# Patient Record
Sex: Male | Born: 1975 | Race: Black or African American | Hispanic: No | Marital: Single | Smoking: Current every day smoker
Health system: Southern US, Community
[De-identification: ages and names within clinical notes are randomized; demographics above are authoritative.]

## PROBLEM LIST (undated history)

## (undated) DIAGNOSIS — G20A1 Parkinson's disease without dyskinesia, without mention of fluctuations: Secondary | ICD-10-CM

## (undated) DIAGNOSIS — H269 Unspecified cataract: Secondary | ICD-10-CM

## (undated) DIAGNOSIS — E119 Type 2 diabetes mellitus without complications: Secondary | ICD-10-CM

## (undated) DIAGNOSIS — R569 Unspecified convulsions: Secondary | ICD-10-CM

## (undated) DIAGNOSIS — G20 Parkinson's disease: Secondary | ICD-10-CM

## (undated) HISTORY — DX: Parkinson's disease without dyskinesia, without mention of fluctuations: G20.A1

## (undated) HISTORY — DX: Unspecified cataract: H26.9

## (undated) HISTORY — DX: Parkinson's disease: G20

---

## 2012-12-06 NOTE — ED Notes (Signed)
Formatting of this note might be different from the original.  Olegario Messier MSW in to speak with pt  Electronically signed by Lorna Few, RN at 12/06/2012  2:51 PM EST

## 2012-12-06 NOTE — ED Provider Notes (Signed)
Formatting of this note is different from the original.  [facility]    ED Provider Note    Anthony Schultz 37 y.o. male DOB: 1976/07/16 MRN: [id number]  History     Chief Complaint   Patient presents with   ? Medication Refill     needs dilantin refill     History provided by:  Patient  Medication Refill  This is a new problem. Episode onset: patient ran out of dilantin on Monday.     Past Medical History   Diagnosis Date   ? Seizures    ? Epilepsy      History reviewed. No pertinent past surgical history.    History   Alcohol Use No     History   Smoking status   ? Light Tobacco Smoker   Smokeless tobacco   ? Not on file     History   Drug Use No     No Known Allergies    Discharge Medication List as of 12/06/2012  2:51 PM     CONTINUE these medications which have NOT CHANGED    Details   !! Phenytoin (DILANTIN PO) Take by mouth., Until Discontinued, Historical Med      !! - Potential duplicate medications found. Please discuss with provider.       Review of Systems   Constitutional: Negative.    Respiratory: Negative.    Cardiovascular: Negative.    Skin: Negative.    Neurological: Negative for dizziness, tremors, seizures, weakness, light-headedness and headaches.   Psychiatric/Behavioral: The patient is not nervous/anxious.      Physical Exam   ED Triage Vitals   BP 12/06/12 1351 121/67 mmHg   Heart Rate 12/06/12 1351 84   Resp 12/06/12 1351 18   SpO2 12/06/12 1351 96 %   Temp 12/06/12 1351 98.2 F (36.8 C)     Physical Exam   Nursing note and vitals reviewed.  Constitutional: He is oriented to person, place, and time. Vital signs are normal. He appears well-developed and well-nourished. No distress.   HENT:   Head: Normocephalic.   Eyes: Pupils are equal, round, and reactive to light.   Cardiovascular: Normal rate.    Pulmonary/Chest: Effort normal.   Musculoskeletal: Normal range of motion.   Neurological: He is alert and oriented to person, place, and time.   Skin: Skin is warm and  dry.   Psychiatric: He has a normal mood and affect. His behavior is normal. Judgment normal.     ED Course     No data to display   Imaging:  No results found for this visit on 12/06/12.  ECG:  No results found for this visit on 12/06/12.    Procedures    MDM  Number of Diagnoses or Management Options  Diagnosis management comments: 2:45 PM  Patient requests refill of Dilantin and recommendations for housing (patient is temporarily in [place], moving from [place] to [place])  Discussed with Social Services  Rx Dilantin      Amount and/or Complexity of Data Reviewed  Decide to obtain previous medical records or to obtain history from someone other than the patient: yes    Patient Progress  Patient progress: stable    Coding    Discharge Medication List as of 12/06/2012  2:51 PM     START taking these medications    Details   !! phenytoin (DILANTIN) 100 MG ER capsule Take 1 capsule (100 mg total) by mouth 3 (three)  times a day., Starting 12/06/2012, Last dose on Fri 01/05/13, Print      !! - Potential duplicate medications found. Please discuss with provider.       Clinical Impression     Final diagnoses:   Encounter for medication refill   URI (upper respiratory infection)     ED Disposition    Disposition Comments    Discharge [facility] discharge to home/self care.  Condition at discharge: Stable        John A. Cyndie Mull  12/08/12 1610  Electronically signed by Malcolm Metro, MD at 12/11/2012 11:50 PM EST

## 2012-12-06 NOTE — Progress Notes (Signed)
Formatting of this note is different from the original.  [facility]  ED Social Work     Patient:   Anthony Schultz  MR Number:  [id number]  Patient Date of Birth: 1976-09-14  Age/Sex:  37 y.o./male        Case Type:  Medication Assistance    Pt states he arrived in [place] 2 weeks ago from [place]. He plans to make his way to [facility] where he lived previously. HE has been staying at the [facility]. He does not have money to pay for his seizure medication. Voucher faxed to [facility] pharmacy for dilantin. Information provided on Community [employer].    Electronically signed:  Fermin Schwab, MSW LCSW  12/06/2012 / 3:18 PM      Electronically signed by Deeann Cree, MSW LCSW at 12/06/2012  3:20 PM EST

## 2012-12-06 NOTE — ED Provider Notes (Signed)
Formatting of this note might be different from the original.  I have reviewed and agree with the MLP's findings and plan for this patient.  Malcolm Metro, MD  [facility] EMERGENCY DEPARTMENT - 12/11/2012 11:50 PM    Malcolm Metro, MD  12/11/12 2350  Electronically signed by Malcolm Metro, MD at 12/11/2012 11:50 PM EST

## 2014-02-26 ENCOUNTER — Encounter (HOSPITAL_COMMUNITY): Payer: Self-pay | Admitting: Emergency Medicine

## 2014-02-26 ENCOUNTER — Emergency Department (HOSPITAL_COMMUNITY)
Admission: EM | Admit: 2014-02-26 | Discharge: 2014-02-26 | Disposition: A | Discharge status: Home / Self Care | Payer: Self-pay | Attending: Emergency Medicine | Admitting: Emergency Medicine

## 2014-02-26 DIAGNOSIS — G40909 Epilepsy, unspecified, not intractable, without status epilepticus: Secondary | ICD-10-CM | POA: Insufficient documentation

## 2014-02-26 DIAGNOSIS — Z760 Encounter for issue of repeat prescription: Secondary | ICD-10-CM

## 2014-02-26 DIAGNOSIS — F17200 Nicotine dependence, unspecified, uncomplicated: Secondary | ICD-10-CM | POA: Insufficient documentation

## 2014-02-26 HISTORY — DX: Unspecified convulsions: R56.9

## 2014-02-26 MED ORDER — PERMETHRIN 5 % EX CREA: TOPICAL_CREAM | CUTANEOUS | Status: DC

## 2014-02-26 MED ORDER — PHENYTOIN SODIUM EXTENDED 100 MG PO CAPS: 100.0000 mg | ORAL_CAPSULE | Freq: Three times a day (TID) | ORAL | Status: DC

## 2014-02-26 MED ORDER — DIVALPROEX SODIUM 500 MG PO DR TAB: 500.0000 mg | DELAYED_RELEASE_TABLET | Freq: Every day | ORAL | Status: DC

## 2014-02-26 NOTE — ED Notes (Signed)
[place] and doesn't have primary doctor set up yet.

## 2014-02-26 NOTE — Discharge Planning (Signed)
P4[facility] William Walls, [employer] Liaison  Spoke to patient about primary care resources and establishing care with a provider. Patient states he is new to the area and is out of his seizure medications. I [facility] with the provider on site, being that the patient is homeless at this time. Primary care resource guide,community resources, and my contact information was provided for any future questions or concerns.

## 2014-02-26 NOTE — Progress Notes (Signed)
  CARE MANAGEMENT ED NOTE 02/26/2014  Patient:  William MedinaMILES,Syrus   Account Number:  [id number]  Date Initiated:  02/26/2014  Documentation initiated by:  Ferdinand CavaSCHETTINO,Caterine Mcmeans  Subjective/Objective Assessment:   38 yo male, homeless, requesting refills for medications     Subjective/Objective Assessment Detail:   William Walls is a 38 y.o. male who presents to the Emergency Department requesting medication refill today. Patient recently moved to [place] from [place] and needs refills for seizure medications. He does not have a PCP in [place].  He takes Depakote 500 mg QD and Dilantin 100 mg TID but states he does not have the funds to pay for them. He denies recent seizures. He denies any other symptoms at this time.     Action/Plan:   Action/Plan Detail:   Anticipated DC Date:       Status Recommendation to Physician:   Result of Recommendation:      DC Planning Services  CM consult  MATCH Program  Other  PCP issues    Choice offered to / List presented to:  C-1 Patient          Status of service:  Completed, signed off  ED Comments:   ED Comments Detail:  Patient stated that he recently moved here from [place] and is homeless. He stated that he has been without his Depakote for his Parkinsons and Dilantin for his history of seizures and that he came in to the ED to try to get these medications filled. Explained the [facility] program to the patient to assist with medications refills and patient stated that he does not have to 3$ copay. Explained that have received approval to override the copay so that once the PA gives him the prescriptions for the medications that he needs to take the [facility] to fill the medications. Also provided the patient with the 5 page resource list for [place]. The [facility] rep, Felicia, was present and informed the patient that she has [facility] so that [facility] to be seen by the NP and then get set  up with the purple cared and [facility] so he can follow up with his medical crae and medication assistance.

## 2014-02-26 NOTE — ED Notes (Signed)
Pt reports out of dilantin 100mg  x 1 month. No seizures reported.

## 2014-02-26 NOTE — Discharge Instructions (Signed)
[facility] and use the resources provided. Refer to attached documents for more information.

## 2014-02-26 NOTE — ED Provider Notes (Signed)
CSN: [id number]     Arrival date & time 02/26/14  1038 History  This chart was scribed for non-physician practitioner Emilia BeckKaitlyn Jeniyah Menor, PA-C working with Layla MawKristen N Ward, DO by Leone PayorSonum Patel, ED Scribe. This patient was seen in room TR11C/TR11C and the patient's care was started at 11:55 AM.    Chief Complaint  Patient presents with  . Medication Refill      The history is provided by the patient. No language interpreter was used.    HPI Comments: William Walls is a 38 y.o. male who presents to the Emergency Department requesting medication refill today. [place] to [place] and needs refills for seizure medications. He does not have a PCP in [place].  He takes Depakote 500 mg QD and Dilantin 100 mg TID but states he does not have the funds to pay for them. He denies recent seizures. He denies any other symptoms at this time.    Past Medical History  Diagnosis Date  . Seizures    History reviewed. No pertinent past surgical history. No family history on file. History  Substance Use Topics  . Smoking status: Current Every Day Smoker  . Smokeless tobacco: Not on file  . Alcohol Use: No    Review of Systems  Constitutional: Negative for fever.  Eyes: Negative for visual disturbance.  Neurological: Negative for seizures.  All other systems reviewed and are negative.     Allergies  Review of patient's allergies indicates no known allergies.  Home Medications   Prior to Admission medications   Not on File   BP 121/69  Pulse 69  Temp(Src) 97.8 F (36.6 C) (Oral)  Resp 20  SpO2 100% Physical Exam  Nursing note and vitals reviewed. Constitutional: He is oriented to person, place, and time. He appears well-developed and well-nourished.  HENT:  Head: Normocephalic and atraumatic.  Eyes: Conjunctivae and EOM are normal.  Neck: Normal range of motion.  Cardiovascular: Normal rate.   Pulmonary/Chest: Effort normal.  Abdominal: He exhibits no  distension.  Musculoskeletal: Normal range of motion.  Neurological: He is alert and oriented to person, place, and time.  Skin: Skin is warm and dry.  Psychiatric: He has a normal mood and affect.    ED Course  Procedures (including critical care time)  DIAGNOSTIC STUDIES: Oxygen Saturation is 100% on RA, normal by my interpretation.    COORDINATION OF CARE: 11:55 AM Discussed treatment plan with pt at bedside and pt agreed to plan.   Labs Review Labs Reviewed - No data to display  Imaging Review No results found.   EKG Interpretation None      MDM   Final diagnoses:  Medication refill    Patient will have refill of Keppra. Vitals stable and patient afebrile. Patient is asymptomatic with no recent seizure.   I personally performed the services described in this documentation, which was scribed in my presence. The recorded information has been reviewed and is accurate.    Emilia BeckKaitlyn Conlan Miceli, [place]C 02/27/14 [phone]

## 2014-02-27 NOTE — ED Provider Notes (Signed)
Medical screening examination/treatment/procedure(s) were performed by non-physician practitioner and as supervising physician I was immediately available for consultation/collaboration.   EKG Interpretation None        [name], DO 02/27/14 0912 

## 2015-01-14 ENCOUNTER — Inpatient Hospital Stay
Admit: 2015-01-14 | Discharge: 2015-01-14 | Discharge status: Against Medical Advice | Payer: Medicare (Managed Care) | Attending: Emergency Medicine

## 2015-01-14 ENCOUNTER — Emergency Department: Admit: 2015-01-14 | Payer: Medicare (Managed Care) | Primary: Family Medicine

## 2015-01-14 DIAGNOSIS — R079 Chest pain, unspecified: Secondary | ICD-10-CM

## 2015-01-14 LAB — METABOLIC PANEL, COMPREHENSIVE
A-G Ratio: 1.1 (ref 0.8–1.7)
ALT (SGPT): 47 U/L (ref 16–61)
AST (SGOT): 32 U/L (ref 15–37)
Albumin: 3.6 g/dL (ref 3.4–5.0)
Alk. phosphatase: 74 U/L (ref 45–117)
Anion gap: 11 mmol/L (ref 3.0–18)
BUN/Creatinine ratio: 19 (ref 12–20)
BUN: 20 MG/DL — ABNORMAL HIGH (ref 7.0–18)
Bilirubin, total: 0.3 MG/DL (ref 0.2–1.0)
CO2: 24 mmol/L (ref 21–32)
Calcium: 8.7 MG/DL (ref 8.5–10.1)
Chloride: 108 mmol/L (ref 100–108)
Creatinine: 1.07 MG/DL (ref 0.6–1.3)
GFR est AA: >60 mL/min/{1.73_m2} (ref >60)
GFR est non-AA: >60 mL/min/{1.73_m2} (ref >60)
Globulin: 3.3 g/dL (ref 2.0–4.0)
Glucose: 112 mg/dL — ABNORMAL HIGH (ref 74–99)
Potassium: 4.1 mmol/L (ref 3.5–5.5)
Protein, total: 6.9 g/dL (ref 6.4–8.2)
Sodium: 143 mmol/L (ref 136–145)

## 2015-01-14 LAB — CBC WITH AUTOMATED DIFF
ABS. BASOPHILS: 0 10*3/uL (ref 0.0–0.06)
ABS. EOSINOPHILS: 0.2 10*3/uL (ref 0.0–0.4)
ABS. LYMPHOCYTES: 1.8 10*3/uL (ref 0.9–3.6)
ABS. MONOCYTES: 0.5 10*3/uL (ref 0.05–1.2)
ABS. NEUTROPHILS: 2.8 10*3/uL (ref 1.8–8.0)
BASOPHILS: 0 % (ref 0–2)
EOSINOPHILS: 3 % (ref 0–5)
HCT: 44.7 % (ref 36.0–48.0)
HGB: 15.1 g/dL (ref 13.0–16.0)
LYMPHOCYTES: 35 % (ref 21–52)
MCH: 29.2 PG (ref 24.0–34.0)
MCHC: 33.8 g/dL (ref 31.0–37.0)
MCV: 86.5 FL (ref 74.0–97.0)
MONOCYTES: 9 % (ref 3–10)
MPV: 10.1 FL (ref 9.2–11.8)
NEUTROPHILS: 53 % (ref 40–73)
PLATELET: 241 10*3/uL (ref 135–420)
RBC: 5.17 M/uL (ref 4.70–5.50)
RDW: 13.8 % (ref 11.6–14.5)
WBC: 5.2 10*3/uL (ref 4.6–13.2)

## 2015-01-14 LAB — URINALYSIS W/ RFLX MICROSCOPIC
Bilirubin: NEGATIVE
Blood: NEGATIVE
Glucose: NEGATIVE mg/dL
Leukocyte Esterase: NEGATIVE
Nitrites: NEGATIVE
Specific gravity: >1.03 — ABNORMAL HIGH (ref 1.005–1.030)
Urobilinogen: 1 EU/dL (ref 0.2–1.0)
pH (UA): 5.5 (ref 5.0–8.0)

## 2015-01-14 LAB — CARDIAC PANEL,(CK, CKMB & TROPONIN)
CK - MB: 1 ng/ml (ref 0.5–3.6)
CK - MB: 1.8 ng/ml (ref 0.5–3.6)
CK - MB: 1.7 ng/ml (ref 0.5–3.6)
CK-MB Index: 0.2 % (ref 0.0–4.0)
CK-MB Index: 0.3 % (ref 0.0–4.0)
CK-MB Index: 0.3 % (ref 0.0–4.0)
CK: 585 U/L — ABNORMAL HIGH (ref 39–308)
CK: 531 U/L — ABNORMAL HIGH (ref 39–308)
CK: 508 U/L — ABNORMAL HIGH (ref 39–308)
Troponin-I, QT: <0.02 NG/ML (ref 0.00–0.06)
Troponin-I, QT: <0.02 NG/ML (ref 0.00–0.06)
Troponin-I, QT: <0.02 NG/ML (ref 0.00–0.06)

## 2015-01-14 LAB — DRUG SCREEN, URINE
AMPHETAMINES: NEGATIVE
BARBITURATES: NEGATIVE
BENZODIAZEPINES: NEGATIVE
COCAINE: NEGATIVE
METHADONE: NEGATIVE
OPIATES: NEGATIVE
PCP(PHENCYCLIDINE): NEGATIVE
THC (TH-CANNABINOL): POSITIVE — AB

## 2015-01-14 LAB — URINE MICROSCOPIC ONLY
Bacteria: NEGATIVE /hpf
WBC: 0 /hpf (ref 0–5)

## 2015-01-14 LAB — LIPASE: Lipase: 150 U/L (ref 73–393)

## 2015-01-14 MED ORDER — KETOROLAC TROMETHAMINE 30 MG/ML INJECTION
30 mg/mL (1 mL) | INTRAMUSCULAR | Status: AC
Start: 2015-01-14 — End: 2015-01-14
  Administered 2015-01-14: 12:00:00 via INTRAVENOUS

## 2015-01-14 MED ORDER — LORAZEPAM 2 MG/ML IJ SOLN
2 mg/mL | Freq: Once | INTRAMUSCULAR | Status: AC
Start: 2015-01-14 — End: 2015-01-14
  Administered 2015-01-14: 14:00:00 via INTRAVENOUS

## 2015-01-14 MED ORDER — ASPIRIN 81 MG CHEWABLE TAB
81 mg | Freq: Every day | ORAL | Status: DC
Start: 2015-01-14 — End: 2015-01-15

## 2015-01-14 MED ORDER — ASPIRIN 81 MG CHEWABLE TAB
81 mg | ORAL | Status: AC
Start: 2015-01-14 — End: 2015-01-14
  Administered 2015-01-14: 12:00:00 via ORAL

## 2015-01-14 MED ORDER — PHENYTOIN SODIUM EXTENDED 100 MG CAP
100 mg | Freq: Three times a day (TID) | ORAL | Status: DC
Start: 2015-01-14 — End: 2015-01-15
  Administered 2015-01-14: 23:00:00 via ORAL

## 2015-01-14 MED ORDER — ENOXAPARIN 40 MG/0.4 ML SUB-Q SYRINGE
40 mg/0.4 mL | SUBCUTANEOUS | Status: DC
Start: 2015-01-14 — End: 2015-01-15
  Administered 2015-01-14: 23:00:00 via SUBCUTANEOUS

## 2015-01-14 MED FILL — LOVENOX 40 MG/0.4 ML SUBCUTANEOUS SYRINGE: 40 mg/0.4 mL | SUBCUTANEOUS | Qty: 0.4

## 2015-01-14 MED FILL — LORAZEPAM 2 MG/ML IJ SOLN: 2 mg/mL | INTRAMUSCULAR | Qty: 1

## 2015-01-14 MED FILL — KETOROLAC TROMETHAMINE 30 MG/ML INJECTION: 30 mg/mL (1 mL) | INTRAMUSCULAR | Qty: 1

## 2015-01-14 MED FILL — ASPIRIN 81 MG CHEWABLE TAB: 81 mg | ORAL | Qty: 4

## 2015-01-14 MED FILL — DILANTIN EXTENDED 100 MG CAPSULE: 100 mg | ORAL | Qty: 1

## 2015-01-14 NOTE — Progress Notes (Signed)
Formatting of this note is different from the original.  Readmission Risk Assessment: Low Risk and MSSP/Good Help ACO patients    RRAT Score: 1 - 12    Initial Assessment:Emergency Contact: chart reviewed and spoke with patient at bedside,pt states he's here visiting from [place],and came to ER with c/o chest pain and SOB,pt did state he has [employer] but doesn't have insurance card,[employer] can assist with financial application.Oral and Written notification given to patient and/or caregiver informing them that they are currently an Outpatient receiving care in our facility.  Outpatient services include Observation Services.     Pertinent Medical Hx: see chart  PCP/Specialists: Community Services:     DME: to be determined    Low Risk Care Transition Plan:  1. Evaluate for [facility] or H2H, community care coordination of resources  2. Involve patient/caregiver in assessment, planning, education and implement of intervention.  3. CM daily patient care huddles/interdisciplinary rounds.  4. PCP/Specialist appointment within 7 - 10 days made prior to discharge.  5. Facilitate transportation and logistics for follow-up appointments.  6. Handoff to [employer] Nurse Navigator or PCP practice.         Electronically signed by Fidela Salisbury, RN at 01/14/2015  2:34 PM EST

## 2015-01-14 NOTE — H&P (Signed)
Formatting of this note is different from the original.  Images from the original note were not included.    History & Physical    Patient: Anthony Schultz MRN: [id number]  CSN: [id number]    Date of Birth: [birth year]  Age: 39 y.o.  Sex: male      DOA: 01/14/2015  Primary Care Provider:  Phys Other, MD    Assessment/Plan     Patient Active Problem List   Diagnosis Code   ? Chest pain R07.9   ? Seizure disorder (HCC) G40.909     Admit to telemetry  Will get serial cardiac enzymes   If negative then will get stress test   NPO past MN   If enzymes trend higher then will start the patient on iv heparin and consult cardiology   Will continue with antiplatelet agent at this point along with SL nitro prn   Oxygen supplementation as needed  Morphine as needed for chest pain  Continue home medications for seizures.  DVT prophylaxis    CC: chest pain    HPI:     Anthony Schultz is a 39 y.o. male who has past history of seizure presents to ER with complains of chest pain that started after he was lifting heavy boxes while helping his girlfriend who was moving. Located center of chest, sharp, no radiation. Not associated nausea, diaphoresis, SOB. He did not had this type of pain before. Denies any smoking, alcohol use and drug use. No family history of heart disease. He has history of seizures. However his urine drug screen is positive for THC. In ER his cardiac enzymes x 2 in normal range. He was bradycardic and His EKG showed bradycardia with non specific ST T wave changes.     Past Medical History   Diagnosis Date   ? Seizure disorder (HCC)      No past surgical history on file.    No family history on file.    History     Social History   ? Marital Status: SINGLE     Spouse Name: N/A   ? Number of Children: N/A   ? Years of Education: N/A     Social History Main Topics   ? Smoking status: Not on file   ? Smokeless tobacco: Not on file   ? Alcohol Use: Not on file   ? Drug Use: Not on file   ? Sexual Activity: Not on file     Other  Topics Concern   ? Not on file     Social History Narrative   ? No narrative on file     Prior to Admission medications    Medication Sig Start Date End Date Taking? Authorizing Provider   phenytoin ER (DILANTIN ER) 100 mg ER capsule Take 100 mg by mouth three (3) times daily.    Phys Other, MD     No Known Allergies    Review of Systems  Gen: No fever, chills, malaise, weight loss/gain.   Heent: No headache, rhinorrhea, epistaxis, ear pain, hearing loss, sinus pain, neck pain/stiffness, sore throat.   Heart: see above  Resp: No cough, hemoptysis, wheezing and shortness of breath.   GI: No nausea, vomiting, diarrhea, constipation, melena or hematochezia.   GU: No urinary obstruction, dysuria or hematuria.   Derm: No rash, new skin lesion or pruritis.   Musc/skeletal: no bone or joint complains.  Vasc: No edema, cyanosis or claudication.   Endo: No heat/cold intolerance, no polyuria,polydipsia or polyphagia.  Neuro: No unilateral weakness, numbness, tingling. No seizures.   Heme: No easy bruising or bleeding.    Physical Exam:     Physical Exam:  BP 121/66 mmHg  Pulse 53  Temp(Src) 97.7 F (36.5 C)  Resp 16  Ht 5\' 5"  (1.651 m)  Wt 106.595 kg (235 lb)  BMI 39.11 kg/m2  SpO2 99%   O2 Device: Room air    Temp (24hrs), Avg:97.9 F (36.6 C), Min:97.6 F (36.4 C), Max:98.3 F (36.8 C)      General:  Awake, cooperative, no distress.   Head:  Normocephalic, without obvious abnormality, atraumatic.   Eyes:  Conjunctivae/corneas clear, sclera anicteric, PERRL, EOMs intact.   Nose: Nares normal. No drainage or sinus tenderness.   Throat: Lips, mucosa, and tongue normal.    Neck: Supple, symmetrical, trachea midline, no adenopathy.     Lungs:   Clear to auscultation bilaterally.     Heart:  Regular rate and rhythm, S1, S2 normal, no murmur, click, rub or gallop.     Abdomen: Soft, non-tender. Bowel sounds normal. No masses,  No organomegaly.   Extremities: Extremities normal, atraumatic, no cyanosis or edema.  Capillary refill normal.   Pulses: 2+ and symmetric all extremities.   Skin: Skin color pink/pale/mottled/flushed, turgor normal. No rashes or lesions   Neurologic: CNII-XII intact. No focal motor or sensory deficit.     Labs Reviewed:    CMP:   Lab Results   Component Value Date/Time    NA 143 01/14/2015 07:00 AM    K 4.1 01/14/2015 07:00 AM    CL 108 01/14/2015 07:00 AM    CO2 24 01/14/2015 07:00 AM    AGAP 11 01/14/2015 07:00 AM    GLU 112* 01/14/2015 07:00 AM    BUN 20* 01/14/2015 07:00 AM    CREA 1.07 01/14/2015 07:00 AM    GFRAA >60 01/14/2015 07:00 AM    GFRNA >60 01/14/2015 07:00 AM    CA 8.7 01/14/2015 07:00 AM    ALB 3.6 01/14/2015 07:00 AM    TP 6.9 01/14/2015 07:00 AM    GLOB 3.3 01/14/2015 07:00 AM    AGRAT 1.1 01/14/2015 07:00 AM    SGOT 32 01/14/2015 07:00 AM    ALT 47 01/14/2015 07:00 AM     CBC:   Lab Results   Component Value Date/Time    WBC 5.2 01/14/2015 07:00 AM    HGB 15.1 01/14/2015 07:00 AM    HCT 44.7 01/14/2015 07:00 AM    PLT 241 01/14/2015 07:00 AM     Procedures/imaging: see electronic medical records for all procedures/Xrays and details which were not copied into this note but were reviewed prior to creation of Plan    CC: Phys Other, MD  Electronically signed by Perrin Maltese, MD at 01/14/2015  3:35 PM EST

## 2015-01-14 NOTE — ED Notes (Signed)
Formatting of this note is different from the original.  Triage note: Patient arrives via EMS from home with chest pain. Patient states he was smoking a cigarette on the couch when chest pain began. Patient denies any cardiac history and was moving boxes yesterday. Patient clutching his chest.    Sepsis Screening completed    (  )Patient meets SIRS criteria.  ( x )Patient does not meet SIRS criteria.    SIRS Criteria is achieved when two or more of the following are present  ? Temperature < 96.65F (36C) or > 100.65F (38.3C)  ? Heart Rate > 90 beats per minute  ? Respiratory Rate > 20 beats per minute  ? WBC count > 12,000 or <4,000 or > 10% bands    (  )Patient has a suspected source of infection.  ( x )Patient does not have a suspected source of infection.  Electronically signed by Polly Cobia, RN at 01/14/2015  6:46 AM EST

## 2015-01-14 NOTE — Unmapped (Signed)
Formatting of this note might be different from the original.  1200 Received report from [facility] in Emergency room.  1210 Transfer report given to floor nurse crystal Stanford Breed RN  1256 Admission history complete, patient oriented to room. Patient has stated at this time that he was directed by a physician at home in [place]. To stop taking his Dilantin, due to parkinson like symptoms and stop taking this drug in January of this year.  Electronically signed by Raquel James, RN at 01/14/2015 12:59 PM EST

## 2015-01-14 NOTE — ED Notes (Signed)
Formatting of this note might be different from the original.  Pt resting quietly on stretcher with eyes closed, responds easily to speech. Pt attempting to contact cousin for ride home prior to ativan able to be given. Pt's hands are shaky, pt verbalized understanding cannot receive medication until ride is present. States toradol has helped pain "some". VSS. Pt is sinus brady on monitor.   Electronically signed by Lieutenant Diego, RN at 01/14/2015  8:12 AM EST

## 2015-01-14 NOTE — ED Notes (Signed)
Formatting of this note might be different from the original.  Received report and assumed care of pt at this time. Pt resting quietly on stretcher with eyes open, VSS. NAD. C/o crushing chest pain onset this AM while smoking cigarette. IV site has no redness, swelling or tenderness. Remains patent and flushes easily with 5 ml NS. Pt denies discomfort to IV site. Bed lowered and locked, side rails up with call bell within reach. Pt oriented to call bell and room.  Electronically signed by Lieutenant Diego, RN at 01/14/2015  7:19 AM EST
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
## 2015-01-14 NOTE — Unmapped (Signed)
Formatting of this note is different from the original.    IDR/SLIDR Summary      Patient: Anthony Schultz MRN: [id number]    Age: 39 y.o.     Birthdate: 1976-11-03 Room/Bed: 342/01   Admit Diagnosis: chest pain, bradycardia  Principal Diagnosis: <principal problem not specified>   Goals: Go home.  Readmission: NO  Quality Measure: Not applicable  VTE Prophylaxis: Not yet ordered.   Influenza Vaccine screening completed? YES  Pneumococcal Vaccine screening completed? YES  Mobility needs: No   Nutrition plan:No  Consults:Case Management    Financial concerns:No  Escalated to CM? NO  RRAT Score: 11   Interventions:H2H  Testing due for pt today? YES  LOS:  days Expected length of stay 0-1 days  Discharge plan: DC Planning. PCP: Phys Other, MD  Transportation needs: No    Days before discharge:one day until discharge   Discharge disposition: Home    Echo today.  Pain relieved with Ativan.  Possible dc if Echo normal.  ? Disability/Medicaid. From [place].     Signed:     Presley Raddle, RN  01/14/2015  3:02 PM    Electronically signed by Presley Raddle, RN at 01/14/2015  3:06 PM EST

## 2015-01-14 NOTE — Progress Notes (Signed)
Formatting of this note might be different from the original.  Echocardiogram completed. Report to follow. Coletta Memos RCS  Electronically signed by Shiela Mayer at 01/14/2015  3:47 PM EST

## 2015-01-14 NOTE — ED Provider Notes (Signed)
Formatting of this note is different from the original.  HPI Comments:   6:49 AM  39 y.o. male presents to ED via EMS from home c/o sharp non-radiating midsternal chest pain (rated 10/10), onset while smoking a cigarette on his couch 50 minutes ago. Associated sxs include shortness of breath. At-home treatment includes Tylenol and he was not given any medication via EMS en route to the ED. He confirms hx of similar chest pain once before which was dx'd as bronchitis. PMHx includes seizures. Neurologist discontinued use of Dilantin. PFHx includes heart disease in father. Other sxs include dysuria. He denies EtOH use, drug use, sore throat, abdominal pain, nausea, vomiting, blood in stool, headache, rash, and any other sxs or complaints.     Patient is a 39 y.o. male presenting with chest pain. The history is provided by the patient. No language interpreter was used.   Chest Pain (Angina)   This is a new problem. The current episode started less than 1 hour ago. The problem has not changed since onset.The problem occurs constantly. Pain location: midsternal. The pain is at a severity of 10/10. The quality of the pain is described as sharp. The pain does not radiate. Associated symptoms include shortness of breath. Pertinent negatives include no nausea. Treatments tried: Tylenol. The treatment provided no relief. Risk factors include male gender.   Written by Jani Files, ED Scribe, as dictated by Hetty Blend, MD       Past Medical History:   Diagnosis Date   ? Seizure disorder (HCC)      No past surgical history on file.    No family history on file.    History     Social History   ? Marital Status: SINGLE     Spouse Name: N/A   ? Number of Children: N/A   ? Years of Education: N/A     Occupational History   ? Not on file.     Social History Main Topics   ? Smoking status: Not on file   ? Smokeless tobacco: Not on file   ? Alcohol Use: Not on file   ? Drug Use: Not on file   ? Sexual Activity: Not on file      Other Topics Concern   ? Not on file     Social History Narrative   ? No narrative on file     ALLERGIES: Review of patient's allergies indicates no known allergies.    Review of Systems   Respiratory: Positive for shortness of breath.    Cardiovascular: Positive for chest pain.   Gastrointestinal: Negative for nausea.   All other systems reviewed and are negative.    Filed Vitals:    01/14/15 1030 01/14/15 1100 01/14/15 1115 01/14/15 1118   BP: 129/78 118/77 115/83    Pulse: 45 64 46 40   Temp:       Resp: 17 23 15 17    Height:       Weight:       SpO2: 94% 94%  98%       Physical Exam   Nursing note and vitals reviewed.    CONSTITUTIONAL: Alert, in no apparent distress; well-developed; overweight. Holding middle of chest.  HEAD:  Normocephalic, atraumatic  EYES: PERRL; EOM's intact.  ENTM: Nose: no rhinorrhea; Throat: no erythema or exudate, mucous membranes moist. Missing front teeth.  Neck:  No JVD, supple without lymphadenopathy  RESP: Chest clear, equal breath sounds.  CHEST: Chest wall tenderness  to palpation.  CV: S1 and S2 WNL; No murmurs, gallops or rubs.  GI: Normal bowel sounds, abdomen soft and non-tender. No masses or organomegaly.  GU: No costo-vertebral angle tenderness.  BACK:  Non-tender  UPPER EXT:  Normal inspection  LOWER EXT: No edema, no calf tenderness.  Distal pulses intact.  NEURO: CN intact, reflexes 2/4 and sym, strength 5/5 and sym, sensation intact.  SKIN: No rashes; Normal for age and stage.  PSYCH:  Alert and oriented, normal affect.    RESULTS:    EKG interpretation 1: (Preliminary)  6:43 AM   Sinus bradycardia. Rate 57 bpm. Otherwise normal ECG. Read by Hetty Blend, MD     EKG FINDING 2:   8:49 AM  Rhythm: sinus bradycardia; rate. 49 bpm; Other findings: non-specific ST and T wave abnormality;  Read by Hetty Blend, MD at 9:13 AM.  Written by Kristopher Oppenheim, ED Scribe, as dictated by Kenn File, MD.     XR CHEST PORT    (Results Pending)  Impression:    No  acute cardiopulmonary disease.    As read by the radiologist.       Labs Reviewed   METABOLIC PANEL, COMPREHENSIVE - Abnormal; Notable for the following:     Glucose 112 (*)     BUN 20 (*)     All other components within normal limits   CARDIAC PANEL,(CK, CKMB & TROPONIN) - Abnormal; Notable for the following:     CK 585 (*)     All other components within normal limits   URINALYSIS W/ RFLX MICROSCOPIC - Abnormal; Notable for the following:     Specific gravity >1.030 (*)     Protein TRACE (*)     Ketone TRACE (*)     All other components within normal limits   DRUG SCREEN, URINE - Abnormal; Notable for the following:     THC (TH-CANNABINOL) POSITIVE (*)     All other components within normal limits   URINE MICROSCOPIC ONLY - Abnormal; Notable for the following:     Mucus FEW (*)     All other components within normal limits   CARDIAC PANEL,(CK, CKMB & TROPONIN) - Abnormal; Notable for the following:     CK 531 (*)     All other components within normal limits   CBC WITH AUTOMATED DIFF   LIPASE     Recent Results (from the past 12 hour(s))   EKG, 12 LEAD, INITIAL    Collection Time: 01/14/15  6:43 AM   Result Value Ref Range    Ventricular Rate 57 BPM    Atrial Rate 57 BPM    P-R Interval 140 ms    QRS Duration 76 ms    Q-T Interval 432 ms    QTC Calculation (Bezet) 420 ms    Calculated P Axis 17 degrees    Calculated R Axis -25 degrees    Calculated T Axis -17 degrees    Diagnosis       Sinus bradycardia  Otherwise normal ECG  No previous ECGs available    CBC WITH AUTOMATED DIFF    Collection Time: 01/14/15  7:00 AM   Result Value Ref Range    WBC 5.2 4.6 - 13.2 K/uL    RBC 5.17 4.70 - 5.50 M/uL    HGB 15.1 13.0 - 16.0 g/dL    HCT 62.9 52.8 - 41.3 %    MCV 86.5 74.0 - 97.0 FL    MCH 29.2 24.0 -  34.0 PG    MCHC 33.8 31.0 - 37.0 g/dL    RDW 16.1 09.6 - 04.5 %    PLATELET 241 135 - 420 K/uL    MPV 10.1 9.2 - 11.8 FL    NEUTROPHILS 53 40 - 73 %    LYMPHOCYTES 35 21 - 52 %    MONOCYTES 9 3 - 10 %    EOSINOPHILS 3 0 - 5  %    BASOPHILS 0 0 - 2 %    ABS. NEUTROPHILS 2.8 1.8 - 8.0 K/UL    ABS. LYMPHOCYTES 1.8 0.9 - 3.6 K/UL    ABS. MONOCYTES 0.5 0.05 - 1.2 K/UL    ABS. EOSINOPHILS 0.2 0.0 - 0.4 K/UL    ABS. BASOPHILS 0.0 0.0 - 0.06 K/UL    DF AUTOMATED     METABOLIC PANEL, COMPREHENSIVE    Collection Time: 01/14/15  7:00 AM   Result Value Ref Range    Sodium 143 136 - 145 mmol/L    Potassium 4.1 3.5 - 5.5 mmol/L    Chloride 108 100 - 108 mmol/L    CO2 24 21 - 32 mmol/L    Anion gap 11 3.0 - 18 mmol/L    Glucose 112 (H) 74 - 99 mg/dL    BUN 20 (H) 7.0 - 18 MG/DL    Creatinine 4.09 0.6 - 1.3 MG/DL    BUN/Creatinine ratio 19 12 - 20      GFR est AA >60 >60 ml/min/1.51m2    GFR est non-AA >60 >60 ml/min/1.19m2    Calcium 8.7 8.5 - 10.1 MG/DL    Bilirubin, total 0.3 0.2 - 1.0 MG/DL    ALT 47 16 - 61 U/L    AST 32 15 - 37 U/L    Alk. phosphatase 74 45 - 117 U/L    Protein, total 6.9 6.4 - 8.2 g/dL    Albumin 3.6 3.4 - 5.0 g/dL    Globulin 3.3 2.0 - 4.0 g/dL    A-G Ratio 1.1 0.8 - 1.7     LIPASE    Collection Time: 01/14/15  7:00 AM   Result Value Ref Range    Lipase 150 73 - 393 U/L   CARDIAC PANEL,(CK, CKMB & TROPONIN)    Collection Time: 01/14/15  7:00 AM   Result Value Ref Range    CK 585 (H) 39 - 308 U/L    CK - MB 1.0 0.5 - 3.6 ng/ml    CK-MB Index 0.2 0.0 - 4.0 %    Troponin-I, Qt. <0.02 0.00 - 0.06 NG/ML   URINALYSIS W/ RFLX MICROSCOPIC    Collection Time: 01/14/15  7:05 AM   Result Value Ref Range    Color YELLOW      Appearance CLEAR      Specific gravity >1.030 (H) 1.005 - 1.030    pH (UA) 5.5 5.0 - 8.0      Protein TRACE (A) NEG mg/dL    Glucose NEGATIVE  NEG mg/dL    Ketone TRACE (A) NEG mg/dL    Bilirubin NEGATIVE  NEG      Blood NEGATIVE  NEG      Urobilinogen 1.0 0.2 - 1.0 EU/dL    Nitrites NEGATIVE  NEG      Leukocyte Esterase NEGATIVE  NEG     DRUG SCREEN, URINE    Collection Time: 01/14/15  7:05 AM   Result Value Ref Range    BENZODIAZEPINE NEGATIVE  NEG      BARBITURATES  NEGATIVE  NEG      THC (TH-CANNABINOL) POSITIVE (A)  NEG      OPIATES NEGATIVE  NEG      PCP(PHENCYCLIDINE) NEGATIVE  NEG      COCAINE NEGATIVE  NEG      AMPHETAMINE NEGATIVE  NEG      METHADONE NEGATIVE  NEG      HDSCOM (NOTE)    URINE MICROSCOPIC ONLY    Collection Time: 01/14/15  7:05 AM   Result Value Ref Range    WBC 0 to 2 0 - 5 /hpf    RBC NONE 0 - 5 /hpf    Epithelial cells FEW 0 - 5 /lpf    Bacteria NEGATIVE  NEG /hpf    Mucus FEW (A) NEG /lpf   CARDIAC PANEL,(CK, CKMB & TROPONIN)    Collection Time: 01/14/15  9:00 AM   Result Value Ref Range    CK 531 (H) 39 - 308 U/L    CK - MB 1.8 0.5 - 3.6 ng/ml    CK-MB Index 0.3 0.0 - 4.0 %    Troponin-I, Qt. <0.02 0.00 - 0.06 NG/ML   EKG, 12 LEAD, SUBSEQUENT    Collection Time: 01/14/15  9:13 AM   Result Value Ref Range    Ventricular Rate 49 BPM    Atrial Rate 49 BPM    P-R Interval 156 ms    QRS Duration 72 ms    Q-T Interval 452 ms    QTC Calculation (Bezet) 408 ms    Calculated P Axis 30 degrees    Calculated R Axis -7 degrees    Calculated T Axis -19 degrees    Diagnosis       Marked sinus bradycardia  Nonspecific ST and T wave abnormality  Abnormal ECG  When compared with ECG of 14-Jan-2015 06:43,  No significant change was found          MDM  Number of Diagnoses or Management Options  Acute chest pain:   Bradycardia:   Diagnosis management comments:     INITIAL CLINICAL IMPRESSION and PLANS:  The patient presents with the primary complaint(s) of: chest pain. The presentation, to include historical aspects and clinical findings are consistent with the DX of atypical chest pain. However, other possible DX's to consider as primary, associated with, or exacerbated by include:    1.  Pleuritic Chest Pain  2.  Pneumothorax  3.  ACS    Considering the above, my initial management plan to evaluate and therapeutic interventions include the following and as noted in the orders:    1.  Labs: CBC, CMP, Lipase, Cardiac Panel, UA, Drug Screen  2.  Imaging: Chest x-ray, EKG  3.  Medications: Toradol, ASA      Amount and/or  Complexity of Data Reviewed  Clinical lab tests: ordered and reviewed  Tests in the radiology section of CPT: ordered and reviewed (Chest x-ray)  Tests in the medicine section of CPT: ordered and reviewed (EKG)  Discuss the patient with other providers: yes Germain Osgood, MD  Asa Saunas, MD  )  Independent visualization of images, tracings, or specimens: yes (Chest x-ray, EKG)    MEDICATIONS GIVEN:  Medications   ketorolac (TORADOL) injection 30 mg (30 mg IntraVENous Given 01/14/15 0712)   aspirin chewable tablet 324 mg (324 mg Oral Given 01/14/15 0700)   LORazepam (ATIVAN) injection 2 mg (2 mg IntraVENous Given 01/14/15 0920)     Procedures    PROGRESS NOTE:  6:49  AM  Initial assessment performed.  Written by Jani Files, ED Scribe, as dictated by Hetty Blend, MD    PROGRESS NOTE:  7:42 AM   Patient is in the room crying, holding his chest. HR is 65 bpm. BP is 134/77 Gave ativan for his anxiety as well as his muscle spasms. His vitals are stable.   Written by Glendora Score, ED Scribe, as dictated by Kenn File, MD.    PROGRESS NOTE:  8:49 AM  Sitting up in bed, smiling now. In no obvious distress. HR is 54 bpm. Will repeat cardiac enzymes and EKG.   Written by Glendora Score, ED Scribe, as dictated by Kenn File, MD.    PROGRESS NOTE:  10:41 AM  Patient is pain free but heart rate has remained in the 40s. Will consult cardiology.   Written by Glendora Score, ED Scribe, as dictated by Kenn File, MD.    PROGRESS NOTE:  10:43 AM  Rhythm strip update: Patient?s HR is down in the 40s. Sign of bradycardia. No intervention needed at this time.   Written by Glendora Score, ED Scribe, as dictated by Kenn File, MD.    CONSULT NOTE:   11:10 AM  Hetty Blend, MD spoke with Germain Osgood, MD   Specialty: Internal Medicine  Discussed pt's hx, disposition, and available diagnostic and imaging results. Reviewed care plans. Consulting physician agrees with plans  as outlined.  Would like consult with cardiology before discussion of possible admission.   Written by Glendora Score, ED Scribe, as dictated by Kenn File, MD.    CONSULT NOTE:   11:10 AM  Hetty Blend, MD spoke with Asa Saunas, MD   Specialty: Cardiology  Discussed pt's hx, disposition, and available diagnostic and imaging results. Reviewed care plans. Consulting physician agrees with plans as outlined.  Would like a bedside echo. Will consider discharge home if normal.   Written by Glendora Score, ED Scribe, as dictated by Kenn File, MD.    PROGRESS NOTE:  11:38 AM  Updated with his lab results, EKG, and need for an echo.   Written by Glendora Score, ED Scribe, as dictated by Kenn File, MD.    DISCUSSION:  11:54 AM  Patient presented with chest pain that began after waking up. He had non-specific changes on KEG. Pain better with ASA and Toradol. Patient has had Bradycardic and occasional hypotensive episodes. Initially wanted bedside echo and poss discharge. However, patient still has occasional hypotension 99/65, will proceed with admission for observation.   Written by Glendora Score, ED Scribe, as dictated by Kenn File, MD.    CONSULT NOTE:   11:54 AM  Hetty Blend, MD spoke with Germain Osgood, MD   Specialty: Internal Medicine  Discussed pt's hx, disposition, and available diagnostic and imaging results. Reviewed care plans. Consulting physician agrees with plans as outlined.  Will admit the patient for observation.   Written by Kristopher Oppenheim, ED Scribe, as dictated by Hetty Blend, MD    ADMISSION NOTE:  11:54 AM  Patient is being admitted to the hospital by Germain Osgood, MD. The results of their tests and reasons for their admission have been discussed with them and/or available family. They convey agreement and understanding for the need to be admitted and for their admission diagnosis.    Written by Kristopher Oppenheim, ED Scribe, as  dictated by Hetty Blend, MD.    CONDITIONS ON ADMISSION:  Deep Vein Thrombosis is not present at the time of admission. Thrombosis is not present at the time of admission. Urinary Tract Infection is not present at the time of admission. Pneumonia is not present at the time of admission. MRSA is not present at the time of admission. Wound infection is not present at the time of admission. Pressure Ulcer is not present at the time of admission.   Written by Kristopher Oppenheim, ED Scribe, as dictated by Hetty Blend, MD.    CLINICAL IMPRESSION:    1. Acute chest pain    2. Bradycardia      ATTESTATIONS:  This note is prepared by Jani Files and Glendora Score, acting as Scribe for Hetty Blend, MD.    Hetty Blend, MD: The scribe's documentation has been prepared under my direction and personally reviewed by me in its entirety. I confirm that the note above accurately reflects all work, treatment, procedures, and medical decision making performed by me.   Electronically signed by Hetty Blend, MD at 01/16/2015 12:37 AM EST

## 2015-01-14 NOTE — ED Notes (Signed)
Formatting of this note might be different from the original.  Pt made aware by Dr. Will Bonnet will be admitted for observation purposes and echo.  Electronically signed by Lieutenant Diego, RN at 01/14/2015 11:54 AM EST

## 2015-01-14 NOTE — ED Notes (Signed)
Formatting of this note might be different from the original.  Pt's family at bedside states will be back to pick pt up when ready to go. Pt requesting breakfast tray, resting quietly on stretcher appears calm and comfortable. VSS.  Electronically signed by Lieutenant Diego, RN at 01/14/2015  9:25 AM EST

## 2015-01-14 NOTE — ED Notes (Signed)
Formatting of this note might be different from the original.  Pt standing up in room fully dressed. Pt aware of pending admission, RN requested pt remove shirt again and place gown back on. Pt is on phone attempting to reach family.   Electronically signed by Lieutenant Diego, RN at 01/14/2015 11:57 AM EST

## 2015-01-14 NOTE — ED Notes (Signed)
Formatting of this note is different from the original.  TRANSFER - OUT REPORT:    Verbal report given to Gerre Pebbles, RN on [facility]  being transferred to telemetry for routine progression of care       Report consisted of patient?s Situation, Background, Assessment and   Recommendations(SBAR).     Information from the following report(s) SBAR, ED Summary, MAR, Recent Results and Cardiac Rhythm sinus bradycardia was reviewed with the receiving nurse.    Lines:   Peripheral IV 01/14/15 Right Antecubital (Active)   Site Assessment Clean, dry, & intact 01/14/2015  7:00 AM   Phlebitis Assessment 0 01/14/2015  7:00 AM   Infiltration Assessment 0 01/14/2015  7:00 AM   Dressing Status Clean, dry, & intact 01/14/2015  7:00 AM   Dressing Type Transparent 01/14/2015  7:00 AM   Hub Color/Line Status Patent;Flushed 01/14/2015  7:00 AM   Action Taken Blood drawn 01/14/2015  7:00 AM       Opportunity for questions and clarification was provided.      Patient transported with:   Passenger transport manager signed by Lieutenant Diego, RN at 01/14/2015 12:04 PM EST

## 2015-01-14 NOTE — ED Notes (Signed)
Formatting of this note might be different from the original.  Pt resting quietly on stretcher with eyes closed, responds easily to speech. Pt remains sinus brady on monitor ranging from mid 40s-60s HR. Pending further orders and disposition as needed. Second set of cardiac enzymes negative.  Electronically signed by Lieutenant Diego, RN at 01/14/2015 11:14 AM EST

## 2015-01-14 NOTE — Unmapped (Signed)
Formatting of this note is different from the original.  TRANSFER - IN REPORT:    Verbal report received from [facility]) on [facility]  being received from ED(unit) for routine progression of care      Report consisted of patient?s Situation, Background, Assessment and   Recommendations(SBAR).     Information from the following report(s) SBAR, Kardex and MAR was reviewed with the receiving nurse.    Opportunity for questions and clarification was provided.      Assessment completed upon patient?s arrival to unit and care assumed.       Electronically signed by Burnett Kanaris., RN at 01/14/2015 12:27 PM EST

## 2015-01-14 NOTE — ED Notes (Signed)
TRANSFER - OUT REPORT:    Verbal report given to Gerre PebblesLisa Watson, RN [facility] for routine progression of care       Report consisted of patient???s Situation, Background, Assessment and   Recommendations(SBAR).     Information from the following report(s) SBAR, ED Summary, MAR, Recent Results and Cardiac Rhythm sinus bradycardia was reviewed with the receiving nurse.    Lines:   Peripheral IV 01/14/15 Right Antecubital (Active)   Site Assessment Clean, dry, & intact 01/14/2015  7:00 AM   Phlebitis Assessment 0 01/14/2015  7:00 AM   Infiltration Assessment 0 01/14/2015  7:00 AM   Dressing Status Clean, dry, & intact 01/14/2015  7:00 AM   Dressing Type Transparent 01/14/2015  7:00 AM   Hub Color/Line Status Patent;Flushed 01/14/2015  7:00 AM   Action Taken Blood drawn 01/14/2015  7:00 AM        Opportunity for questions and clarification was provided.      Patient transported with:   Monitor  Tech

## 2015-01-14 NOTE — ED Notes (Signed)
Triage note: Patient arrives via EMS from home with chest pain. Patient states he was smoking a cigarette on the couch when chest pain began. Patient denies any cardiac history and was moving boxes yesterday. Patient clutching his chest.    Sepsis Screening completed    (  )Patient meets SIRS criteria.  ( x )Patient does not meet SIRS criteria.      SIRS Criteria is achieved when two or more of the following are present  ? Temperature < 96.8??F (36??C) or > 100.9??F (38.3??C)  ? Heart Rate > 90 beats per minute  ? Respiratory Rate > 20 beats per minute  ? WBC count > 12,000 or <4,000 or > 10% bands      (  )Patient has a suspected source of infection.  ( x )Patient does not have a suspected source of infection.

## 2015-01-14 NOTE — ED Provider Notes (Signed)
HPI Comments:   6:49 AM  39 y.o. male presents to ED via EMS from home c/o sharp non-radiating midsternal chest pain (rated 10/10), onset while smoking a cigarette on his couch 50 minutes ago. Associated sxs include shortness of breath. At-home treatment includes Tylenol and he was not given any medication via EMS en route to the ED. He confirms hx of similar chest pain once before which was dx'd as bronchitis. PMHx includes seizures. Neurologist discontinued use of Dilantin. PFHx includes heart disease in father. Other sxs include dysuria. He denies EtOH use, drug use, sore throat, abdominal pain, nausea, vomiting, blood in stool, headache, rash, and any other sxs or complaints.     Patient is a 39 y.o. male presenting with chest pain. The history is provided by the patient. No language interpreter was used.   Chest Pain (Angina)   This is a new problem. The current episode started less than 1 hour ago. The problem has not changed since onset.The problem occurs constantly. Pain location: midsternal. The pain is at a severity of 10/10. The quality of the pain is described as sharp. The pain does not radiate. Associated symptoms include shortness of breath. Pertinent negatives include no nausea. Treatments tried: Tylenol. The treatment provided no relief. Risk factors include male gender.   Written by Marina Gravel, ED Scribe, as dictated by Jackelyn Hoehn, MD        Past Medical History:   Diagnosis Date   ??? Seizure disorder [facility])        No past surgical history on file.      No family history on file.    History     Social History   ??? Marital Status: SINGLE     Spouse Name: N/A   ??? Number of Children: N/A   ??? Years of Education: N/A     Occupational History   ??? Not on file.     Social History Main Topics   ??? Smoking status: Not on file   ??? Smokeless tobacco: Not on file   ??? Alcohol Use: Not on file   ??? Drug Use: Not on file   ??? Sexual Activity: Not on file     Other Topics Concern   ??? Not on file      Social History Narrative   ??? No narrative on file           ALLERGIES: Review of patient's allergies indicates no known allergies.      Review of Systems   Respiratory: Positive for shortness of breath.    Cardiovascular: Positive for chest pain.   Gastrointestinal: Negative for nausea.   All other systems reviewed and are negative.      Filed Vitals:    01/14/15 1030 01/14/15 1100 01/14/15 1115 01/14/15 1118   BP: 129/78 118/77 115/83    Pulse: 45 64 46 40   Temp:       Resp: 17 23 15 17    Height:       Weight:       SpO2: 94% 94%  98%            Physical Exam   Nursing note and vitals reviewed.    CONSTITUTIONAL: Alert, in no apparent distress; well-developed; overweight. Holding middle of chest.  HEAD:  Normocephalic, atraumatic  EYES: PERRL; EOM's intact.  ENTM: Nose: no rhinorrhea; Throat: no erythema or exudate, mucous membranes moist. Missing front teeth.  Neck:  No JVD, supple without lymphadenopathy  RESP:  Chest clear, equal breath sounds.  CHEST: Chest wall tenderness to palpation.  CV: S1 and S2 WNL; No murmurs, gallops or rubs.  GI: Normal bowel sounds, abdomen soft and non-tender. No masses or organomegaly.  GU: No costo-vertebral angle tenderness.  BACK:  Non-tender  UPPER EXT:  Normal inspection  LOWER EXT: No edema, no calf tenderness.  Distal pulses intact.  NEURO: CN intact, reflexes 2/4 and sym, strength 5/5 and sym, sensation intact.  SKIN: No rashes; Normal for age and stage.  PSYCH:  Alert and oriented, normal affect.      RESULTS:    EKG interpretation 1: (Preliminary)  6:43 AM   Sinus bradycardia. Rate 57 bpm. Otherwise normal ECG. Read by Jackelyn Hoehn, MD     EKG FINDING 2:   8:49 AM  Rhythm: sinus bradycardia; rate. 49 bpm; Other findings: non-specific ST and T wave abnormality;  Read by Jackelyn Hoehn, MD at 9:13 AM.  Written by Haze Justin, ED Scribe, as dictated by Carolin Guernsey, MD.     XR CHEST PORT    (Results Pending)  Impression:  ??   No acute cardiopulmonary disease.    As read by the radiologist.        Labs Reviewed   METABOLIC PANEL, COMPREHENSIVE - Abnormal; Notable for the following:     Glucose 112 (*)     BUN 20 (*)     All other components within normal limits   CARDIAC PANEL,(CK, CKMB & TROPONIN) - Abnormal; Notable for the following:     CK 585 (*)     All other components within normal limits   URINALYSIS W/ RFLX MICROSCOPIC - Abnormal; Notable for the following:     Specific gravity >1.030 (*)     Protein TRACE (*)     Ketone TRACE (*)     All other components within normal limits   DRUG SCREEN, URINE - Abnormal; Notable for the following:     THC (TH-CANNABINOL) POSITIVE (*)     All other components within normal limits   URINE MICROSCOPIC ONLY - Abnormal; Notable for the following:     Mucus FEW (*)     All other components within normal limits   CARDIAC PANEL,(CK, CKMB & TROPONIN) - Abnormal; Notable for the following:     CK 531 (*)     All other components within normal limits   CBC WITH AUTOMATED DIFF   LIPASE       Recent Results (from the past 12 hour(s))   EKG, 12 LEAD, INITIAL    Collection Time: 01/14/15  6:43 AM   Result Value Ref Range    Ventricular Rate 57 BPM    Atrial Rate 57 BPM    P-R Interval 140 ms    QRS Duration 76 ms    Q-T Interval 432 ms    QTC Calculation (Bezet) 420 ms    Calculated P Axis 17 degrees    Calculated R Axis -25 degrees    Calculated T Axis -17 degrees    Diagnosis       Sinus bradycardia  Otherwise normal ECG  No previous ECGs available     CBC WITH AUTOMATED DIFF    Collection Time: 01/14/15  7:00 AM   Result Value Ref Range    WBC 5.2 4.6 - 13.2 K/uL    RBC 5.17 4.70 - 5.50 M/uL    HGB 15.1 13.0 - 16.0 g/dL    HCT 44.7 36.0 - 48.0 %  MCV 86.5 74.0 - 97.0 FL    MCH 29.2 24.0 - 34.0 PG    MCHC 33.8 31.0 - 37.0 g/dL    RDW 13.8 11.6 - 14.5 %    PLATELET 241 135 - 420 K/uL    MPV 10.1 9.2 - 11.8 FL    NEUTROPHILS 53 40 - 73 %    LYMPHOCYTES 35 21 - 52 %    MONOCYTES 9 3 - 10 %     EOSINOPHILS 3 0 - 5 %    BASOPHILS 0 0 - 2 %    ABS. NEUTROPHILS 2.8 1.8 - 8.0 K/UL    ABS. LYMPHOCYTES 1.8 0.9 - 3.6 K/UL    ABS. MONOCYTES 0.5 0.05 - 1.2 K/UL    ABS. EOSINOPHILS 0.2 0.0 - 0.4 K/UL    ABS. BASOPHILS 0.0 0.0 - 0.06 K/UL    DF AUTOMATED     METABOLIC PANEL, COMPREHENSIVE    Collection Time: 01/14/15  7:00 AM   Result Value Ref Range    Sodium 143 136 - 145 mmol/L    Potassium 4.1 3.5 - 5.5 mmol/L    Chloride 108 100 - 108 mmol/L    CO2 24 21 - 32 mmol/L    Anion gap 11 3.0 - 18 mmol/L    Glucose 112 (H) 74 - 99 mg/dL    BUN 20 (H) 7.0 - 18 MG/DL    Creatinine 1.07 0.6 - 1.3 MG/DL    BUN/Creatinine ratio 19 12 - 20      GFR est AA >60 >60 ml/min/1.109m    GFR est non-AA >60 >60 ml/min/1.739m   Calcium 8.7 8.5 - 10.1 MG/DL    Bilirubin, total 0.3 0.2 - 1.0 MG/DL    ALT 47 16 - 61 U/L    AST 32 15 - 37 U/L    Alk. phosphatase 74 45 - 117 U/L    Protein, total 6.9 6.4 - 8.2 g/dL    Albumin 3.6 3.4 - 5.0 g/dL    Globulin 3.3 2.0 - 4.0 g/dL    A-G Ratio 1.1 0.8 - 1.7     LIPASE    Collection Time: 01/14/15  7:00 AM   Result Value Ref Range    Lipase 150 73 - 393 U/L   CARDIAC PANEL,(CK, CKMB & TROPONIN)    Collection Time: 01/14/15  7:00 AM   Result Value Ref Range    CK 585 (H) 39 - 308 U/L    CK - MB 1.0 0.5 - 3.6 ng/ml    CK-MB Index 0.2 0.0 - 4.0 %    Troponin-I, Qt. <0.02 0.00 - 0.06 NG/ML   URINALYSIS W/ RFLX MICROSCOPIC    Collection Time: 01/14/15  7:05 AM   Result Value Ref Range    Color YELLOW      Appearance CLEAR      Specific gravity >1.030 (H) 1.005 - 1.030    pH (UA) 5.5 5.0 - 8.0      Protein TRACE (A) NEG mg/dL    Glucose NEGATIVE  NEG mg/dL    Ketone TRACE (A) NEG mg/dL    Bilirubin NEGATIVE  NEG      Blood NEGATIVE  NEG      Urobilinogen 1.0 0.2 - 1.0 EU/dL    Nitrites NEGATIVE  NEG      Leukocyte Esterase NEGATIVE  NEG     DRUG SCREEN, URINE    Collection Time: 01/14/15  7:05 AM   Result Value Ref Range  BENZODIAZEPINE NEGATIVE  NEG      BARBITURATES NEGATIVE  NEG       THC (TH-CANNABINOL) POSITIVE (A) NEG      OPIATES NEGATIVE  NEG      PCP(PHENCYCLIDINE) NEGATIVE  NEG      COCAINE NEGATIVE  NEG      AMPHETAMINE NEGATIVE  NEG      METHADONE NEGATIVE  NEG      HDSCOM (NOTE)    URINE MICROSCOPIC ONLY    Collection Time: 01/14/15  7:05 AM   Result Value Ref Range    WBC 0 to 2 0 - 5 /hpf    RBC NONE 0 - 5 /hpf    Epithelial cells FEW 0 - 5 /lpf    Bacteria NEGATIVE  NEG /hpf    Mucus FEW (A) NEG /lpf   CARDIAC PANEL,(CK, CKMB & TROPONIN)    Collection Time: 01/14/15  9:00 AM   Result Value Ref Range    CK 531 (H) 39 - 308 U/L    CK - MB 1.8 0.5 - 3.6 ng/ml    CK-MB Index 0.3 0.0 - 4.0 %    Troponin-I, Qt. <0.02 0.00 - 0.06 NG/ML   EKG, 12 LEAD, SUBSEQUENT    Collection Time: 01/14/15  9:13 AM   Result Value Ref Range    Ventricular Rate 49 BPM    Atrial Rate 49 BPM    P-R Interval 156 ms    QRS Duration 72 ms    Q-T Interval 452 ms    QTC Calculation (Bezet) 408 ms    Calculated P Axis 30 degrees    Calculated R Axis -7 degrees    Calculated T Axis -19 degrees    Diagnosis       Marked sinus bradycardia  Nonspecific ST and T wave abnormality  Abnormal ECG  When compared with ECG of 14-Jan-2015 06:43,  No significant change was found            MDM  Number of Diagnoses or Management Options  Acute chest pain:   Bradycardia:   Diagnosis management comments:     INITIAL CLINICAL IMPRESSION and PLANS:  The patient presents with the primary complaint(s) of: chest pain. The presentation, to include historical aspects and clinical findings are consistent with the DX of atypical chest pain. However, other possible DX's to consider as primary, associated with, or exacerbated by include:    1.  Pleuritic Chest Pain  2.  Pneumothorax  3.  ACS    Considering the above, my initial management plan to evaluate and therapeutic interventions include the following and as noted in the orders:    1.  Labs: CBC, CMP, Lipase, Cardiac Panel, UA, Drug Screen  2.  Imaging: Chest x-ray, EKG   3.  Medications: Toradol, ASA       Amount and/or Complexity of Data Reviewed  Clinical lab tests: ordered and reviewed  Tests in the radiology section of CPT??: ordered and reviewed (Chest x-ray)  Tests in the medicine section of CPT??: ordered and reviewed (EKG)  Discuss the patient with other providers: yes Marlane Hatcher, MD  Ival Bible, MD  )  Independent visualization of images, tracings, or specimens: yes (Chest x-ray, EKG)        MEDICATIONS GIVEN:  Medications   ketorolac (TORADOL) injection 30 mg (30 mg IntraVENous Given 01/14/15 0712)   aspirin chewable tablet 324 mg (324 mg Oral Given 01/14/15 0700)   LORazepam (ATIVAN) injection 2 mg (2  mg IntraVENous Given 01/14/15 0920)       Procedures      PROGRESS NOTE:  6:49 AM  Initial assessment performed.  Written by Marina Gravel, ED Scribe, as dictated by Jackelyn Hoehn, MD    PROGRESS NOTE:  7:42 AM   Patient is in the room crying, holding his chest. HR is 65 bpm. BP is 134/77 Gave ativan for his anxiety as well as his muscle spasms. His vitals are stable.   Written by Sherrilyn Rist, ED Scribe, as dictated by Carolin Guernsey, MD.    PROGRESS NOTE:  8:49 AM  Sitting up in bed, smiling now. In no obvious distress. HR is 54 bpm. Will repeat cardiac enzymes and EKG.   Written by Sherrilyn Rist, ED Scribe, as dictated by Carolin Guernsey, MD.    PROGRESS NOTE:  10:41 AM  Patient is pain free but heart rate has remained in the 40s. Will consult cardiology.   Written by Sherrilyn Rist, ED Scribe, as dictated by Carolin Guernsey, MD.    PROGRESS NOTE:  10:43 AM  Rhythm strip update: Patient???s HR is down in the 40s. Sign of bradycardia. No intervention needed at this time.   Written by Sherrilyn Rist, ED Scribe, as dictated by Carolin Guernsey, MD.    CONSULT NOTE:   11:10 AM  Jackelyn Hoehn, MD spoke with Marlane Hatcher, MD   Specialty: Internal Medicine  Discussed pt's hx, disposition, and available diagnostic and imaging  results. Reviewed care plans. Consulting physician agrees with plans as outlined.  Would like consult with cardiology before discussion of possible admission.   Written by Sherrilyn Rist, ED Scribe, as dictated by Carolin Guernsey, MD.    CONSULT NOTE:   11:10 AM  Jackelyn Hoehn, MD spoke with Ival Bible, MD   Specialty: Cardiology  Discussed pt's hx, disposition, and available diagnostic and imaging results. Reviewed care plans. Consulting physician agrees with plans as outlined.  Would like a bedside echo. Will consider discharge home if normal.   Written by Sherrilyn Rist, ED Scribe, as dictated by Carolin Guernsey, MD.    PROGRESS NOTE:  11:38 AM  Updated with his lab results, EKG, and need for an echo.   Written by Sherrilyn Rist, ED Scribe, as dictated by Carolin Guernsey, MD.    DISCUSSION:  11:54 AM  Patient presented with chest pain that began after waking up. He had non-specific changes on KEG. Pain better with ASA and Toradol. Patient has had Bradycardic and occasional hypotensive episodes. Initially wanted bedside echo and poss discharge. However, patient still has occasional hypotension 99/65, will proceed with admission for observation.   Written by Sherrilyn Rist, ED Scribe, as dictated by Carolin Guernsey, MD.    CONSULT NOTE:   11:54 AM  Jackelyn Hoehn, MD spoke with Marlane Hatcher, MD   Specialty: Internal Medicine  Discussed pt's hx, disposition, and available diagnostic and imaging results. Reviewed care plans. Consulting physician agrees with plans as outlined.  Will admit the patient for observation.   Written by Haze Justin, ED Scribe, as dictated by Jackelyn Hoehn, MD     ADMISSION NOTE:  11:54 AM  Patient is being admitted to the hospital by Marlane Hatcher, MD. The results of their tests and reasons for their admission have been discussed with them and/or available family. They convey agreement and understanding  for the need to be admitted and for their admission diagnosis.  Written by Haze Justin, ED Scribe, as dictated by Jackelyn Hoehn, MD.    CONDITIONS ON ADMISSION:  Deep Vein Thrombosis is not present at the time of admission. Thrombosis is not present at the time of admission. Urinary Tract Infection is not present at the time of admission. Pneumonia is not present at the time of admission. MRSA is not present at the time of admission. Wound infection is not present at the time of admission. Pressure Ulcer is not present at the time of admission.   Written by Haze Justin, ED Scribe, as dictated by Jackelyn Hoehn, MD.        CLINICAL IMPRESSION:    1. Acute chest pain    2. Bradycardia          ATTESTATIONS:  This note is prepared by Marina Gravel and Sherrilyn Rist, acting as Scribe for Jackelyn Hoehn, MD.    Jackelyn Hoehn, MD: The scribe's documentation has been prepared under my direction and personally reviewed by me in its entirety. I confirm that the note above accurately reflects all work, treatment, procedures, and medical decision making performed by me.

## 2015-01-14 NOTE — ED Notes (Signed)
Pt standing up in room fully dressed. Pt aware of pending admission, RN requested pt remove shirt again and place gown back on. Pt is on phone attempting to reach family.

## 2015-01-14 NOTE — ED Notes (Signed)
Pt resting quietly on stretcher with eyes closed, responds easily to speech. Pt remains sinus brady on monitor ranging from mid 40s-60s HR. Pending further orders and disposition as needed. Second set of cardiac enzymes negative.

## 2015-01-14 NOTE — ED Notes (Signed)
Received report and assumed care of pt at this time. Pt resting quietly on stretcher with eyes open, VSS. NAD. C/o crushing chest pain onset this AM while smoking cigarette. IV site has no redness, swelling or tenderness. Remains patent and flushes easily with 5 ml NS. Pt denies discomfort to IV site. Bed lowered and locked, side rails up with call bell within reach. Pt oriented to call bell and room.

## 2015-01-14 NOTE — ED Notes (Signed)
Pt's family at bedside states will be back to pick pt up when ready to go. Pt requesting breakfast tray, resting quietly on stretcher appears calm and comfortable. VSS.

## 2015-01-14 NOTE — H&P (Signed)
History & Physical    Patient: Anthony Schultz MRN: [id number]  CSN: [id number]    Date of Birth: [birth year]  Age: 39 y.o.  Sex: male      DOA: 01/14/2015  Primary Care Provider:  Phys Other, MD      Assessment/Plan     Patient Active Problem List   Diagnosis Code   ??? Chest pain R07.9   ??? Seizure disorder (HCC) G40.909     Admit to telemetry  Will get serial cardiac enzymes   If negative then will get stress test   NPO past MN   If enzymes trend higher then will start the patient on iv heparin and consult cardiology   Will continue with antiplatelet agent at this point along with SL nitro prn   Oxygen supplementation as needed  Morphine as needed for chest pain  Continue home medications for seizures.  DVT prophylaxis            CC: chest pain       HPI:     Anthony Schultz is a 39 y.o. male who has past history of seizure presents to ER with complains of chest pain that started after he was lifting heavy boxes while helping his girlfriend who was moving. Located center of chest, sharp, no radiation. Not associated nausea, diaphoresis, SOB. He did not had this type of pain before. Denies any smoking, alcohol use and drug use. No family history of heart disease. He has history of seizures. However his urine drug screen is positive for THC. In ER his cardiac enzymes x 2 in normal range. He was bradycardic and His EKG showed bradycardia with non specific ST T wave changes.     Past Medical History   Diagnosis Date   ??? Seizure disorder (HCC)        No past surgical history on file.    No family history on file.    History     Social History   ??? Marital Status: SINGLE     Spouse Name: N/A   ??? Number of Children: N/A   ??? Years of Education: N/A     Social History Main Topics   ??? Smoking status: Not on file   ??? Smokeless tobacco: Not on file   ??? Alcohol Use: Not on file   ??? Drug Use: Not on file   ??? Sexual Activity: Not on file     Other Topics Concern   ??? Not on file     Social History Narrative   ??? No narrative on file        Prior to Admission medications    Medication Sig Start Date End Date Taking? Authorizing Provider   phenytoin ER (DILANTIN ER) 100 mg ER capsule Take 100 mg by mouth three (3) times daily.    Phys Other, MD       No Known Allergies    Review of Systems  Gen: No fever, chills, malaise, weight loss/gain.   Heent: No headache, rhinorrhea, epistaxis, ear pain, hearing loss, sinus pain, neck pain/stiffness, sore throat.   Heart: see above  Resp: No cough, hemoptysis, wheezing and shortness of breath.   GI: No nausea, vomiting, diarrhea, constipation, melena or hematochezia.   GU: No urinary obstruction, dysuria or hematuria.   Derm: No rash, new skin lesion or pruritis.   Musc/skeletal: no bone or joint complains.  Vasc: No edema, cyanosis or claudication.   Endo: No heat/cold intolerance, no polyuria,polydipsia or polyphagia.  Neuro: No unilateral weakness, numbness, tingling. No seizures.   Heme: No easy bruising or bleeding.          Physical Exam:     Physical Exam:  BP 121/66 mmHg   Pulse 53   Temp(Src) 97.7 ??F (36.5 ??C)   Resp 16   Ht 5\' 5"  (1.651 m)   Wt 106.595 kg (235 lb)   BMI 39.11 kg/m2   SpO2 99%   O2 Device: Room air    Temp (24hrs), Avg:97.9 ??F (36.6 ??C), Min:97.6 ??F (36.4 ??C), Max:98.3 ??F (36.8 ??C)             General:  Awake, cooperative, no distress.   Head:  Normocephalic, without obvious abnormality, atraumatic.   Eyes:  Conjunctivae/corneas clear, sclera anicteric, PERRL, EOMs intact.   Nose: Nares normal. No drainage or sinus tenderness.   Throat: Lips, mucosa, and tongue normal.    Neck: Supple, symmetrical, trachea midline, no adenopathy.       Lungs:   Clear to auscultation bilaterally.       Heart:  Regular rate and rhythm, S1, S2 normal, no murmur, click, rub or gallop.     Abdomen: Soft, non-tender. Bowel sounds normal. No masses,  No organomegaly.   Extremities: Extremities normal, atraumatic, no cyanosis or edema. Capillary refill normal.   Pulses: 2+ and symmetric all extremities.    Skin: Skin color pink/pale/mottled/flushed, turgor normal. No rashes or lesions   Neurologic: CNII-XII intact. No focal motor or sensory deficit.       Labs Reviewed:    CMP:   Lab Results   Component Value Date/Time    NA 143 01/14/2015 07:00 AM    K 4.1 01/14/2015 07:00 AM    CL 108 01/14/2015 07:00 AM    CO2 24 01/14/2015 07:00 AM    AGAP 11 01/14/2015 07:00 AM    GLU 112* 01/14/2015 07:00 AM    BUN 20* 01/14/2015 07:00 AM    CREA 1.07 01/14/2015 07:00 AM    GFRAA >60 01/14/2015 07:00 AM    GFRNA >60 01/14/2015 07:00 AM    CA 8.7 01/14/2015 07:00 AM    ALB 3.6 01/14/2015 07:00 AM    TP 6.9 01/14/2015 07:00 AM    GLOB 3.3 01/14/2015 07:00 AM    AGRAT 1.1 01/14/2015 07:00 AM    SGOT 32 01/14/2015 07:00 AM    ALT 47 01/14/2015 07:00 AM     CBC:   Lab Results   Component Value Date/Time    WBC 5.2 01/14/2015 07:00 AM    HGB 15.1 01/14/2015 07:00 AM    HCT 44.7 01/14/2015 07:00 AM    PLT 241 01/14/2015 07:00 AM         Procedures/imaging: see electronic medical records for all procedures/Xrays and details which were not copied into this note but were reviewed prior to creation of Plan        CC: Phys Other, MD

## 2015-01-14 NOTE — Other (Signed)
IDR/SLIDR Summary          Patient: Anthony Schultz MRN: [id number]    Age: 39 y.o.     Birthdate: 1976-02-23 Room/Bed: 342/01   Admit Diagnosis: chest pain, bradycardia  Principal Diagnosis: <principal problem not specified>   Goals: Go home.  Readmission: NO  Quality Measure: Not applicable  VTE Prophylaxis: Not yet ordered.   Influenza Vaccine screening completed? YES  Pneumococcal Vaccine screening completed? YES  Mobility needs: No   Nutrition plan:No  Consults:Case Management    Financial concerns:No  Escalated to CM? NO  RRAT Score: 11   Interventions:H2H  Testing due for pt today? YES  LOS:  days Expected length of stay 0-1 days  Discharge plan: DC Planning. PCP: Phys Other, MD  Transportation needs: No    Days before discharge:one day until discharge   Discharge disposition: Home    Echo today.  Pain relieved with Ativan.  Possible dc if Echo normal.  ? Disability/[place]icaid. From [place].     Signed:     Presley RaddleChristine N Analese Sovine, RN  01/14/2015  3:02 PM

## 2015-01-14 NOTE — Other (Signed)
TRANSFER - IN REPORT:    [facility]  being received from ED(unit) for routine progression of care      Report consisted of patient???s Situation, Background, Assessment and   Recommendations(SBAR).     Information from the following report(s) SBAR, Kardex and MAR was reviewed with the receiving nurse.    Opportunity for questions and clarification was provided.      Assessment completed upon patient???s arrival to unit and care assumed.

## 2015-01-14 NOTE — Other (Signed)
1200 Received report from [facility] R[facility] in Emergency room.  1210 Transfer report given to floor nurse crystal Stanford BreedAllard RN  1256 Admission history complete, patient oriented to room. Patient has stated at this time that [place] Dilantin, due to parkinson like symptoms and stop taking this drug in January of this year.

## 2015-01-14 NOTE — Progress Notes (Signed)
Readmission Risk Assessment: Low Risk and MSSP/Good Help ACO patients    RRAT Score: 1 - 12    Initial Assessment:Emergency Contact: chart reviewed and spoke with patient at bedside,pt states he's here visiting from [place],and came to ER with c/o chest pain and SOB,pt did state he has [employer] insurance but doesn't have insurance card,[facility] can assist with financial application.Oral and Written notification given to patient and/or caregiver informing them that they are currently an Outpatient receiving care in our facility.  Outpatient services include Observation Services.     Pertinent Medical Hx: see chart  PCP/Specialists: Community Services:     DME: to be determined    Low Risk Care Transition Plan:  1. [facility], community care coordination of resources  2. Involve patient/caregiver in assessment, planning, education and implement of intervention.  3. CM daily patient care huddles/interdisciplinary rounds.  4. PCP/Specialist appointment within 7 - 10 days made prior to discharge.  5. Facilitate transportation and logistics for follow-up appointments.  6. Handoff to [facility] Nurse Navigator or PCP practice.

## 2015-01-14 NOTE — Progress Notes (Signed)
Echocardiogram completed. Report to follow. Coletta MemosVelda Bowles-Williams RCS

## 2015-01-14 NOTE — ED Notes (Signed)
Pt made aware by Dr. Will BonnetHutchison will be admitted for observation purposes and echo.
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
## 2015-01-14 NOTE — ED Notes (Addendum)
Pt resting quietly on stretcher with eyes closed, responds easily to speech. Pt attempting to contact cousin for ride home prior to ativan able to be given. Pt's hands are shaky, pt verbalized understanding cannot receive medication until ride is present. States toradol has helped pain "some". VSS. Pt is sinus brady on monitor.

## 2015-01-15 NOTE — Discharge Summary (Signed)
Formatting of this note is different from the original.  Images from the original note were not included.    Discharge Summary    Patient: Anthony Schultz MRN: [id number]  CSN: [id number]    Date of Birth: Nov 23, 1975  Age: 39 y.o.  Sex: male    DOA: 01/14/2015 LOS:    Discharge Date: 01/14/2015     Primary Care Provider:  Phys Other, MD    Admission Diagnoses: chest pain, bradycardia    Discharge Diagnoses:    Problem List as of 01/14/2015  Never Reviewed          Codes Class Noted - Resolved    Chest pain ICD-10-CM: R07.9  ICD-9-CM: 786.50  01/14/2015 - Present      Seizure disorder (HCC) ICD-10-CM: G40.909  ICD-9-CM: 345.90  Unknown - Present         Procedures : echo    PHYSICAL EXAM   Visit Vitals   Item Reading   ? BP 109/66 mmHg   ? Pulse 50   ? Temp 98.5 F (36.9 C)   ? Resp 18   ? Ht 5\' 5"  (1.651 m)   ? Wt 106.595 kg (235 lb)   ? BMI 39.11 kg/m2   ? SpO2 100%           Admission HPI : Anthony Schultz is a 39 y.o. male who has past history of seizure presents to ER with complains of chest pain that started after he was lifting heavy boxes while helping his girlfriend who was moving. Located center of chest, sharp, no radiation. Not associated nausea, diaphoresis, SOB. He did not had this type of pain before. Denies any smoking, alcohol use and drug use. No family history of heart disease. He has history of seizures. However his urine drug screen is positive for THC. In ER his cardiac enzymes x 2 in normal range. He was bradycardic and His EKG showed bradycardia with non specific ST T wave changes.     Hospital Course :   Patient admitted to telemetry, his cardiac enzymes x 3 in normal range. He had echo done that showed normal LVF. Cardiology recommended exercise stress test however patient left AMA.    Labs: Results:     Chemistry Recent Labs      01/14/15   0700   GLU  112*   NA  143   K  4.1   CL  108   CO2  24   BUN  20*   CREA  1.07   CA  8.7   AGAP  11   BUCR  19   AP  74   TP  6.9   ALB  3.6   GLOB  3.3   AGRAT   1.1     CBC w/Diff Recent Labs      01/14/15   0700   WBC  5.2   RBC  5.17   HGB  15.1   HCT  44.7   PLT  241   GRANS  53   LYMPH  35   EOS  3     Cardiac Enzymes Recent Labs      01/14/15   1554  01/14/15   0900   CPK  508*  531*   CKND1  0.3  0.3     Coagulation No results for input(s): PTP, INR, APTT in the last 72 hours.    Invalid input(s): INREXT    Lipid Panel No results found for: CHOL, CHOLPOCT,  CHOLX, CHLST, CHOLV, [id number], HDL, LDL, NLDLCT, DLDL, LDLC, DLDLP, [id number], VLDLC, VLDL, TGL, TGLX, TRIGL, [id number], TRIGP, TGLPOCT, [id number], CHHD, CHHDX   BNP No results for input(s): BNPP in the last 72 hours.   Liver Enzymes Recent Labs      01/14/15   0700   TP  6.9   ALB  3.6   AP  74   SGOT  32     Thyroid Studies No results found for: T4, T3U, TSH, TSHEXT       Significant Diagnostic Studies: Xr Chest [facility]    01/14/2015   History: Chest pain Comparison: None.  Exam performed AP portable at 0 716.  Findings: The lungs appear clear. The cardiac silhouette and pulmonary vascularity appear within normal limits. No evidence for pneumothorax or pleural effusion.     01/14/2015   Impression:  No acute cardiopulmonary disease.     CC: Phys Other, MD  Electronically signed by Perrin Maltese, MD at 01/15/2015 11:43 AM EST

## 2015-01-15 NOTE — Discharge Summary (Signed)
Discharge Summary    Patient: Anthony Schultz Tawney MRN: [id number]  CSN: [id number]    Date of Birth: Oct 21, 1976  Age: 39 y.o.  Sex: male    DOA: 01/14/2015 LOS:    Discharge Date: 01/14/2015     Primary Care Provider:  Phys Other, MD    Admission Diagnoses: chest pain, bradycardia    Discharge Diagnoses:    Problem List as of 01/14/2015  Never Reviewed          Codes Class Noted - Resolved    Chest pain ICD-10-CM: R07.9  ICD-9-CM: 786.50  01/14/2015 - Present        Seizure disorder (HCC) ICD-10-CM: G40.909  ICD-9-CM: 345.90  Unknown - Present                    Procedures : echo          PHYSICAL EXAM   Visit Vitals   Item Reading   ??? BP 109/66 mmHg   ??? Pulse 50   ??? Temp 98.5 ??F (36.9 ??C)   ??? Resp 18   ??? Ht 5\' 5"  (1.651 m)   ??? Wt 106.595 kg (235 lb)   ??? BMI 39.11 kg/m2   ??? SpO2 100%                                         Admission HPI : Anthony Schultz [place] is a 39 y.o. male who has past history of seizure presents to ER with complains of chest pain that started after he was lifting heavy boxes while helping his girlfriend who was moving. Located center of chest, sharp, no radiation. Not associated nausea, diaphoresis, SOB. He did not had this type of pain before. Denies any smoking, alcohol use and drug use. No family history of heart disease. He has history of seizures. However his urine drug screen is positive for THC. In ER his cardiac enzymes x 2 in normal range. He was bradycardic and His EKG showed bradycardia with non specific ST T wave changes.     Hospital Course :   Patient admitted to telemetry, his cardiac enzymes x 3 in normal range. He had echo done that showed normal LVF. Cardiology recommended exercise stress test however patient left AMA.          Labs: Results:       Chemistry Recent Labs      01/14/15   0700   GLU  112*   NA  143   K  4.1   CL  108   CO2  24   BUN  20*   CREA  1.07   CA  8.7   AGAP  11   BUCR  19   AP  74   TP  6.9   ALB  3.6   GLOB  3.3   AGRAT  1.1      CBC w/Diff Recent Labs      01/14/15    0700   WBC  5.2   RBC  5.17   HGB  15.1   HCT  44.7   PLT  241   GRANS  53   LYMPH  35   EOS  3      Cardiac Enzymes Recent Labs      01/14/15   1554  01/14/15   0900   CPK  508*  531*   CKND1  0.3  0.3      Coagulation No results for input(s): PTP, INR, APTT in the last 72 hours.    Invalid input(s): INREXT    Lipid Panel No results found for: CHOL, CHOLPOCT, CHOLX, CHLST, CHOLV, [id number], HDL, LDL, NLDLCT, DLDL, LDLC, DLDLP, 161096, VLDLC, VLDL, TGL, TGLX, TRIGL, [id number], TRIGP, TGLPOCT, [id number], CHHD, CHHDX   BNP No results for input(s): BNPP in the last 72 hours.   Liver Enzymes Recent Labs      01/14/15   0700   TP  6.9   ALB  3.6   AP  74   SGOT  32      Thyroid Studies No results found for: T4, T3U, TSH, TSHEXT         Significant Diagnostic Studies: Xr Chest [facility]    01/14/2015   History: Chest pain Comparison: None.  Exam performed AP portable at 0 716.  Findings: The lungs appear clear. The cardiac silhouette and pulmonary vascularity appear within normal limits. No evidence for pneumothorax or pleural effusion.     01/14/2015   Impression:  No acute cardiopulmonary disease.                 CC: Phys Other, MD

## 2015-01-17 ENCOUNTER — Encounter (HOSPITAL_COMMUNITY): Payer: Self-pay | Admitting: *Deleted

## 2015-01-17 ENCOUNTER — Emergency Department (HOSPITAL_COMMUNITY): Payer: Self-pay

## 2015-01-17 ENCOUNTER — Emergency Department (HOSPITAL_COMMUNITY)
Admission: EM | Admit: 2015-01-17 | Discharge: 2015-01-18 | Disposition: A | Discharge status: Home / Self Care | Payer: Self-pay | Attending: Emergency Medicine | Admitting: Emergency Medicine

## 2015-01-17 DIAGNOSIS — Z720 Tobacco use: Secondary | ICD-10-CM | POA: Insufficient documentation

## 2015-01-17 DIAGNOSIS — G40909 Epilepsy, unspecified, not intractable, without status epilepticus: Secondary | ICD-10-CM | POA: Insufficient documentation

## 2015-01-17 DIAGNOSIS — R0789 Other chest pain: Secondary | ICD-10-CM | POA: Insufficient documentation

## 2015-01-17 LAB — CBC
HCT: 38.9 % — ABNORMAL LOW (ref 39.0–52.0)
HEMOGLOBIN: 13.2 g/dL (ref 13.0–17.0)
MCH: 29 pg (ref 26.0–34.0)
MCHC: 33.9 g/dL (ref 30.0–36.0)
MCV: 85.5 fL (ref 78.0–100.0)
Platelets: 216 10*3/uL (ref 150–400)
RBC: 4.55 MIL/uL (ref 4.22–5.81)
RDW: 13.6 % (ref 11.5–15.5)
WBC: 6.4 10*3/uL (ref 4.0–10.5)

## 2015-01-17 LAB — EKG, 12 LEAD, SUBSEQUENT
Atrial Rate: 49 {beats}/min
Calculated P Axis: 30 degrees
Calculated R Axis: -7 degrees
Calculated T Axis: -19 degrees
P-R Interval: 156 ms
Q-T Interval: 452 ms
QRS Duration: 72 ms
QTC Calculation (Bezet): 408 ms
Ventricular Rate: 49 {beats}/min

## 2015-01-17 LAB — EKG, 12 LEAD, INITIAL
Atrial Rate: 57 {beats}/min
Calculated P Axis: 17 degrees
Calculated R Axis: -25 degrees
Calculated T Axis: -17 degrees
P-R Interval: 140 ms
Q-T Interval: 432 ms
QRS Duration: 76 ms
QTC Calculation (Bezet): 420 ms
Ventricular Rate: 57 {beats}/min

## 2015-01-17 NOTE — ED Provider Notes (Signed)
CSN: [id number]     Arrival date & time 01/17/15  2248 History  This chart was scribed for William Raceravid Amran Malter, MD by Bronson CurbJacqueline Melvin, ED Scribe. This patient was seen in room D35C/D35C and the patient's care was started at 11:58 PM.    Chief Complaint  Patient presents with  . Chest Pain    The history is provided by the patient. No language interpreter was used.     HPI Comments: William Walls is a 39 y.o. male who presents to the Emergency Department complaining of intermittent, non-radiating, sharp, central chest pain for the past month. He notes the pain started after helping his cousin move, and was later evaluated and [facility] [place], [place]. He states imaging was done and he was discharged the following day. Patient states episodes last for approximately 30 minutes. He notes the pain is worse approximately 1/2 hour after eating, and also reports exacerbation of the pain with movement. He denies pain with deep breathing. He reports family history of MI on the paternal side, stating his father suffered a heart attack at age 37 during a leg amputation. He denies SOB, diaphoresis, nausea, vomiting, abdominal pain. Patient is a current half ppd smoker for the past 27 years. Patient states current episode of pain started roughly 10 minutes ago.   Past Medical History  Diagnosis Date  . Seizures    History reviewed. No pertinent past surgical history. No family history on file. History  Substance Use Topics  . Smoking status: Current Every Day Smoker  . Smokeless tobacco: Not on file  . Alcohol Use: No    Review of Systems  Constitutional: Negative for fever and chills.  Respiratory: Negative for cough, chest tightness and shortness of breath.   Cardiovascular: Positive for chest pain. Negative for palpitations and leg swelling.  Gastrointestinal: Negative for nausea, vomiting, abdominal pain, diarrhea and constipation.  Musculoskeletal: Negative  for back pain, neck pain and neck stiffness.  Skin: Negative for rash and wound.  Neurological: Negative for dizziness, weakness, numbness and headaches.  All other systems reviewed and are negative.     Allergies  Review of patient's allergies indicates no known allergies.  Home Medications   Prior to Admission medications   Medication Sig Start Date End Date Taking? Authorizing Provider  divalproex (DEPAKOTE) 500 MG DR tablet Take 1 tablet (500 mg total) by mouth daily. Patient not taking: Reported on 01/17/2015 02/26/14   Emilia BeckKaitlyn Szekalski, PA-C  ibuprofen (ADVIL,MOTRIN) 600 MG tablet Take 1 tablet (600 mg total) by mouth 3 (three) times daily after meals. 01/18/15   William Raceravid Harvey Matlack, MD  permethrin (ELIMITE) 5 % cream Apply to affected area once Patient not taking: Reported on 01/17/2015 02/26/14   Emilia BeckKaitlyn Szekalski, PA-C  phenytoin (DILANTIN) 100 MG ER capsule Take 1 capsule (100 mg total) by mouth 3 (three) times daily. Patient not taking: Reported on 01/17/2015 02/26/14   Emilia BeckKaitlyn Szekalski, PA-C   Triage Vitals: BP 113/61 mmHg  Pulse 54  Temp(Src) 98.5 F (36.9 C)  Resp 16  Ht 5\' 5"  (1.651 m)  Wt 180 lb (81.647 kg)  BMI 29.95 kg/m2  SpO2 97%  Physical Exam  Constitutional: He is oriented to person, place, and time. He appears well-developed and well-nourished. No distress.  HENT:  Head: Normocephalic and atraumatic.  Mouth/Throat: Oropharynx is clear and moist. No oropharyngeal exudate.  Eyes: Conjunctivae and EOM are normal. Pupils are equal, round, and reactive to light.  Neck: Normal range of  motion. Neck supple. No tracheal deviation present.  Cardiovascular: Normal rate and regular rhythm.  Exam reveals no gallop and no friction rub.   No murmur heard. Pulmonary/Chest: Effort normal and breath sounds normal. No respiratory distress. He has no wheezes. He has no rales. He exhibits tenderness (chest tenderness iscompletely reproduced with palpation over the left sternal  border. There is no crepitance or deformity.).  Abdominal: Soft. Bowel sounds are normal. He exhibits no distension and no mass. There is no tenderness. There is no rebound and no guarding.  Musculoskeletal: Normal range of motion. He exhibits no edema or tenderness.  No calf swelling or tenderness.  Neurological: He is alert and oriented to person, place, and time.  Moves all extremities without deficit. Sensation is grossly intact.  Skin: Skin is warm and dry. No rash noted. No erythema.  Psychiatric: He has a normal mood and affect. His behavior is normal.  Nursing note and vitals reviewed.   ED Course  Procedures (including critical care time)  DIAGNOSTIC STUDIES: Oxygen Saturation is 97% on room air, adequate by my interpretation.    COORDINATION OF CARE: At 0005 Discussed treatment plan with patient. Patient agrees.   Labs Review Labs Reviewed  CBC - Abnormal; Notable for the following:    HCT 38.9 (*)    All other components within normal limits  BASIC METABOLIC PANEL - Abnormal; Notable for the following:    GFR calc non Af Amer 81 (*)    All other components within normal limits  TROPONIN I  Rosezena Sensor, ED    Imaging Review Dg Chest 2 View  01/18/2015   CLINICAL DATA:  Centralized chest pain for 1 day.  EXAM: CHEST  2 VIEW  COMPARISON:  None.  FINDINGS: Lung volumes are low leading to crowding of bronchovascular structures. The cardiomediastinal contours are normal for technique. Pulmonary vasculature is normal. No consolidation, pleural effusion, or pneumothorax. No acute osseous abnormalities are seen.  IMPRESSION: Hypoventilatory chest, no definite acute process.   Electronically Signed   By: Rubye Oaks M.D.   On: 01/18/2015 00:41     EKG Interpretation   Date/Time:  Friday January 17 2015 22:52:19 EST Ventricular Rate:  49 PR Interval:  144 QRS Duration: 70 QT Interval:  424 QTC Calculation: 383 R Axis:   21 Text Interpretation:  Sinus bradycardia  T wave abnormality, consider  inferior ischemia Abnormal ECG No previous tracing Confirmed by BEATON   MD, ROBERT ([id number]) on 01/17/2015 10:58:39 PM      MDM   Final diagnoses:  Chest wall pain    I personally performed the services described in this documentation, which was scribed in my presence. The recorded information has been reviewed and is accurate.  Patient with reproducible left parasternal pain. He does have some risk factors for coronary artery disease. EKG with T-wave inversions in lateral leads. No previous EKG for comparison. We will attempt to obtain medical records from [facility].   Review of hospital records from 01/14/15 demonstrate unchanged EKG. Patient also had a normal transthoracic echocardiogram.   Patient's presentation is very atypical for coronary artery disease. The symptoms are reproduced with palpation over the lateral sternal edge of the sternum. Patient has troponin 2 which are normal. He has an EKG which is unchanged from previous. Patient does have some risk factors for coronary artery disease and he is advised to follow-up with cardiology. He is also advised to stop smoking. Will start on NSAIDs. He's been given return  precautions and is voiced understanding.  William Racer, MD 01/18/15 [phone]

## 2015-01-17 NOTE — ED Notes (Signed)
The pt is c/o mid=chest pain for one week.  He was in [place] and was admitted for the chest pain.  He signed himself ama from there because he has to help a family member move.  No previous history

## 2015-01-18 LAB — BASIC METABOLIC PANEL
Anion gap: 9 (ref 5–15)
BUN: 16 mg/dL (ref 6–23)
CALCIUM: 9 mg/dL (ref 8.4–10.5)
CHLORIDE: 106 mmol/L (ref 96–112)
CO2: 25 mmol/L (ref 19–32)
Creatinine, Ser: 1.14 mg/dL (ref 0.50–1.35)
GFR calc Af Amer: >90 mL/min (ref >90)
GFR calc non Af Amer: 81 mL/min — ABNORMAL LOW (ref >90)
Glucose, Bld: 98 mg/dL (ref 70–99)
Potassium: 3.7 mmol/L (ref 3.5–5.1)
Sodium: 140 mmol/L (ref 135–145)

## 2015-01-18 LAB — TROPONIN I

## 2015-01-18 LAB — I-STAT TROPONIN, ED: Troponin i, poc: 0 ng/mL (ref 0.00–0.08)

## 2015-01-18 MED ORDER — IBUPROFEN 600 MG PO TABS: 600.0000 mg | ORAL_TABLET | Freq: Three times a day (TID) | ORAL | Status: DC

## 2015-01-18 NOTE — Discharge Instructions (Signed)
Chest Wall Pain Chest wall pain is pain in or around the bones and muscles of your chest. It may take up to 6 weeks to get better. It may take longer if you must stay physically active in your work and activities.  CAUSES  Chest wall pain may happen on its own. However, it may be caused by:  A viral illness like the flu.  Injury.  Coughing.  Exercise.  Arthritis.  Fibromyalgia.  Shingles. HOME CARE INSTRUCTIONS   Avoid overtiring physical activity. Try not to strain or perform activities that cause pain. This includes any activities using your chest or your abdominal and side muscles, especially if heavy weights are used.  Put ice on the sore area.  Put ice in a plastic bag.  Place a towel between your skin and the bag.  Leave the ice on for 15-20 minutes per hour while awake for the first 2 days.  Only take over-the-counter or prescription medicines for pain, discomfort, or fever as directed by your caregiver. SEEK IMMEDIATE MEDICAL CARE IF:   Your pain increases, or you are very uncomfortable.  You have a fever.  Your chest pain becomes worse.  You have new, unexplained symptoms.  You have nausea or vomiting.  You feel sweaty or lightheaded.  You have a cough with phlegm (sputum), or you cough up blood. MAKE SURE YOU:   Understand these instructions.  Will watch your condition.  Will get help right away if you are not doing well or get worse. Document Released: 10/25/2005 Document Revised: 01/17/2012 Document Reviewed: 06/21/2011 [facility] Patient Information 2015 [place], [place]. This information is not intended to replace advice given to you by your health care provider. Make sure you discuss any questions you have with your health care provider.  Smoking Cessation Quitting smoking is important to your health and has many advantages. However, it is not always easy to quit since nicotine is a very addictive drug. Oftentimes, people try 3 times or more before  being able to quit. This document explains the best ways for you to prepare to quit smoking. Quitting takes hard work and a lot of effort, but you can do it. ADVANTAGES OF QUITTING SMOKING  You will live longer, feel better, and live better.  Your body will feel the impact of quitting smoking almost immediately.  Within 20 minutes, blood pressure decreases. Your pulse returns to its normal level.  After 8 hours, carbon monoxide levels in the blood return to normal. Your oxygen level increases.  After 24 hours, the chance of having a heart attack starts to decrease. Your breath, hair, and body stop smelling like smoke.  After 48 hours, damaged nerve endings begin to recover. Your sense of taste and smell improve.  After 72 hours, the body is virtually free of nicotine. Your bronchial tubes relax and breathing becomes easier.  After 2 to 12 weeks, lungs can hold more air. Exercise becomes easier and circulation improves.  The risk of having a heart attack, stroke, cancer, or lung disease is greatly reduced.  After 1 year, the risk of coronary heart disease is cut in half.  After 5 years, the risk of stroke falls to the same as a nonsmoker.  After 10 years, the risk of lung cancer is cut in half and the risk of other cancers decreases significantly.  After 15 years, the risk of coronary heart disease drops, usually to the level of a nonsmoker.  If you are pregnant, quitting smoking will improve your  chances of having a healthy baby.  The people you live with, especially any children, will be healthier.  You will have extra money to spend on things other than cigarettes. QUESTIONS TO THINK ABOUT BEFORE ATTEMPTING TO QUIT You may want to talk about your answers with your health care provider.  Why do you want to quit?  If you tried to quit in the past, what helped and what did not?  What will be the most difficult situations for you after you quit? How will you plan to handle  them?  Who can help you through the tough times? Your family? Friends? A health care provider?  What pleasures do you get from smoking? What ways can you still get pleasure if you quit? Here are some questions to ask your health care provider:  How can you help me to be successful at quitting?  What medicine do you think would be best for me and how should I take it?  What should I do if I need more help?  What is smoking withdrawal like? How can I get information on withdrawal? GET READY  Set a quit date.  Change your environment by getting rid of all cigarettes, ashtrays, matches, and lighters in your home, car, or work. Do not let people smoke in your home.  Review your past attempts to quit. Think about what worked and what did not. GET SUPPORT AND ENCOURAGEMENT You have a better chance of being successful if you have help. You can get support in many ways.  Tell your family, friends, and coworkers that you are going to quit and need their support. Ask them not to smoke around you.  Get individual, group, or telephone counseling and support. Programs are available at [facility]. Call your local health department for information about programs in your area.  Spiritual beliefs and practices may help some smokers quit.  Download a "quit meter" on your computer to keep track of quit statistics, such as how long you have gone without smoking, cigarettes not smoked, and money saved.  Get a self-help book about quitting smoking and staying off tobacco. LEARN NEW SKILLS AND BEHAVIORS  Distract yourself from urges to smoke. Talk to someone, go for a walk, or occupy your time with a task.  Change your normal routine. Take a different route to work. Drink tea instead of coffee. Eat breakfast in a different place.  Reduce your stress. Take a hot bath, exercise, or read a book.  Plan something enjoyable to do every day. Reward yourself for not  smoking.  Explore interactive web-based programs that specialize in helping you quit. GET MEDICINE AND USE IT CORRECTLY Medicines can help you stop smoking and decrease the urge to smoke. Combining medicine with the above behavioral methods and support can greatly increase your chances of successfully quitting smoking.  Nicotine replacement therapy helps deliver nicotine to your body without the negative effects and risks of smoking. Nicotine replacement therapy includes nicotine gum, lozenges, inhalers, nasal sprays, and skin patches. Some may be available over-the-counter and others require a prescription.  Antidepressant medicine helps people abstain from smoking, but how this works is unknown. This medicine is available by prescription.  Nicotinic receptor partial agonist medicine simulates the effect of nicotine in your brain. This medicine is available by prescription. Ask your health care provider for advice about which medicines to use and how to use them based on your health history. Your health care provider will tell you  what side effects to look out for if you choose to be on a medicine or therapy. Carefully read the information on the package. Do not use any other product containing nicotine while using a nicotine replacement product.  RELAPSE OR DIFFICULT SITUATIONS Most relapses occur within the first 3 months after quitting. Do not be discouraged if you start smoking again. Remember, most people try several times before finally quitting. You may have symptoms of withdrawal because your body is used to nicotine. You may crave cigarettes, be irritable, feel very hungry, cough often, get headaches, or have difficulty concentrating. The withdrawal symptoms are only temporary. They are strongest when you first quit, but they will go away within 10-14 days. To reduce the chances of relapse, try to:  Avoid drinking alcohol. Drinking lowers your chances of successfully quitting.  Reduce the  amount of caffeine you consume. Once you quit smoking, the amount of caffeine in your body increases and can give you symptoms, such as a rapid heartbeat, sweating, and anxiety.  Avoid smokers because they can make you want to smoke.  Do not let weight gain distract you. Many smokers will gain weight when they quit, usually less than 10 pounds. Eat a healthy diet and stay active. You can always lose the weight gained after you quit.  Find ways to improve your mood other than smoking. FOR MORE INFORMATION  www.smokefree.gov  Document Released: 10/19/2001 Document Revised: 03/11/2014 Document Reviewed: 02/03/2012 [facility] Patient Information 2015 [place], [place]. This information is not intended to replace advice given to you by your health care provider. Make sure you discuss any questions you have with your health care provider.

## 2015-03-21 ENCOUNTER — Encounter (HOSPITAL_COMMUNITY): Payer: Self-pay

## 2015-03-21 ENCOUNTER — Emergency Department (HOSPITAL_COMMUNITY)
Admission: EM | Admit: 2015-03-21 | Discharge: 2015-03-21 | Disposition: A | Discharge status: Home / Self Care | Payer: Self-pay | Attending: Emergency Medicine | Admitting: Emergency Medicine

## 2015-03-21 DIAGNOSIS — Z79899 Other long term (current) drug therapy: Secondary | ICD-10-CM | POA: Insufficient documentation

## 2015-03-21 DIAGNOSIS — S61235A Puncture wound without foreign body of left ring finger without damage to nail, initial encounter: Secondary | ICD-10-CM | POA: Insufficient documentation

## 2015-03-21 DIAGNOSIS — Y998 Other external cause status: Secondary | ICD-10-CM | POA: Insufficient documentation

## 2015-03-21 DIAGNOSIS — Y288XXA Contact with other sharp object, undetermined intent, initial encounter: Secondary | ICD-10-CM | POA: Insufficient documentation

## 2015-03-21 DIAGNOSIS — Y9289 Other specified places as the place of occurrence of the external cause: Secondary | ICD-10-CM | POA: Insufficient documentation

## 2015-03-21 DIAGNOSIS — Y9389 Activity, other specified: Secondary | ICD-10-CM | POA: Insufficient documentation

## 2015-03-21 DIAGNOSIS — Z720 Tobacco use: Secondary | ICD-10-CM | POA: Insufficient documentation

## 2015-03-21 DIAGNOSIS — Z791 Long term (current) use of non-steroidal anti-inflammatories (NSAID): Secondary | ICD-10-CM | POA: Insufficient documentation

## 2015-03-21 DIAGNOSIS — G40909 Epilepsy, unspecified, not intractable, without status epilepticus: Secondary | ICD-10-CM | POA: Insufficient documentation

## 2015-03-21 MED ORDER — BACITRACIN 500 UNIT/GM EX OINT
1.0000 "application " | TOPICAL_OINTMENT | Freq: Once | CUTANEOUS | Status: AC
  Administered 2015-03-21: 1 via TOPICAL
  Filled 2015-03-21: qty 0.9

## 2015-03-21 MED ORDER — LEVETIRACETAM 500 MG PO TABS: 500.0000 mg | ORAL_TABLET | Freq: Two times a day (BID) | ORAL | Status: DC

## 2015-03-21 MED ORDER — CEPHALEXIN 500 MG PO CAPS: 500.0000 mg | ORAL_CAPSULE | Freq: Four times a day (QID) | ORAL | Status: DC

## 2015-03-21 MED ORDER — LEVETIRACETAM 500 MG PO TABS
500.0000 mg | ORAL_TABLET | Freq: Once | ORAL | Status: AC
  Administered 2015-03-21: 500 mg via ORAL
  Filled 2015-03-21: qty 1

## 2015-03-21 MED ORDER — TETANUS-DIPHTH-ACELL PERTUSSIS 5-2.5-18.5 LF-MCG/0.5 IM SUSP
0.5000 mL | Freq: Once | INTRAMUSCULAR | Status: AC
  Administered 2015-03-21: 0.5 mL via INTRAMUSCULAR
  Filled 2015-03-21: qty 0.5

## 2015-03-21 NOTE — Discharge Instructions (Signed)
Return to the emergency room for severely worsening abdominal pain, abdominal pain that localizes to a particular area (especially the right lower part of the belly), pain that persists past 8-10 hours, blood in stool or vomit, severe weakness, fainting, or fever.   Maintain hydration by drinking small amounts of clear fluids frequently, then soft diet, and then advance to a solid diet as tolerated. Avoid foods that are spicy, high in fat or dairy.  Do not hesitate to return to the Emergency Department for any new, worsening or concerning symptoms.   If you do not have a primary care doctor you can establish one at the   [facility]: [address] [place] [id number] [phone]  After you establish care. Let them know you were seen in the emergency room. They must obtain records for further management.    Epilepsy People with epilepsy have times when they shake and jerk uncontrollably (seizures). This happens when there is a sudden change in brain function. Epilepsy may have many possible causes. Anything that disturbs the normal pattern of brain cell activity can lead to seizures. HOME CARE   Follow your doctor's instructions about driving and safety during normal activities.  Get enough sleep.  Only take medicine as told by your doctor.  Avoid things that you know can cause you to have seizures (triggers).  Write down when your seizures happen and what you remember about each seizure. Write down anything you think may have caused the seizure to happen.  Tell the people you live and work with that you have seizures. Make sure they know how to help you. They should:  Cushion your head and body.  Turn you on your side.  Not restrain you.  Not place anything inside your mouth.  Call for local emergency medical help if there is any question about what has happened.  Keep all follow-up visits with your doctor. This is very important. GET HELP IF:  You get  an infection or start to feel sick. You may have more seizures when you are sick.  You are having seizures more often.  Your seizure pattern is changing. GET HELP RIGHT AWAY IF:   A seizure does not stop after a few seconds or minutes.  A seizure causes you to have trouble breathing.  A seizure gives you a very bad headache.  A seizure makes you unable to speak or use a part of your body. Document Released: 08/22/2009 Document Revised: 08/15/2013 Document Reviewed: 06/06/2013 [facility] Patient Information 2015 [place], [place]. This information is not intended to replace advice given to you by your health care provider. Make sure you discuss any questions you have with your health care provider.  Puncture Wound A puncture wound is an injury that extends through all layers of the skin and into the tissue beneath the skin (subcutaneous tissue). Puncture wounds become infected easily because germs often enter the body and go beneath the skin during the injury. Having a deep wound with a small entrance point makes it difficult for your caregiver to adequately clean the wound. This is especially true if you have stepped on a nail and it has passed through a dirty shoe or other situations where the wound is obviously contaminated. CAUSES  Many puncture wounds involve glass, nails, splinters, fish hooks, or other objects that enter the skin (foreign bodies). A puncture wound may also be caused by a human bite or animal bite. DIAGNOSIS  A puncture wound is usually diagnosed by  your history and a physical exam. You may need to have an X-ray or an ultrasound to check for any foreign bodies still in the wound. TREATMENT   Your caregiver will clean the wound as thoroughly as possible. Depending on the location of the wound, a bandage (dressing) may be applied.  Your caregiver might prescribe antibiotic medicines.  You may need a follow-up visit to check on your wound. Follow all instructions as  directed by your caregiver. HOME CARE INSTRUCTIONS   Change your dressing once per day, or as directed by your caregiver. If the dressing sticks, it may be removed by soaking the area in water.  If your caregiver has given you follow-up instructions, it is very important that you return for a follow-up appointment. Not following up as directed could result in a chronic or permanent injury, pain, and disability.  Only take over-the-counter or prescription medicines for pain, discomfort, or fever as directed by your caregiver.  If you are given antibiotics, take them as directed. Finish them even if you start to feel better. You may need a tetanus shot if:  You cannot remember when you had your last tetanus shot.  You have never had a tetanus shot. If you got a tetanus shot, your arm may swell, get red, and feel warm to the touch. This is common and not a problem. If you need a tetanus shot and you choose not to have one, there is a rare chance of getting tetanus. Sickness from tetanus can be serious. You may need a rabies shot if an animal bite caused your puncture wound. SEEK MEDICAL CARE IF:   You have redness, swelling, or increasing pain in the wound.  You have red streaks going away from the wound.  You notice a bad smell coming from the wound or dressing.  You have yellowish-white fluid (pus) coming from the wound.  You are treated with an antibiotic for infection, but the infection is not getting better.  You notice something in the wound, such as rubber from your shoe, cloth, or another object.  You have a fever.  You have severe pain.  You have difficulty breathing.  You feel dizzy or faint.  You cannot stop vomiting.  You lose feeling, develop numbness, or cannot move a limb below the wound.  Your symptoms worsen. MAKE SURE YOU:  Understand these instructions.  Will watch your condition.  Will get help right away if you are not doing well or get  worse. Document Released: 08/04/2005 Document Revised: 01/17/2012 Document Reviewed: 04/13/2011 [facility] Patient Information 2015 [place], [place]. This information is not intended to replace advice given to you by your health care provider. Make sure you discuss any questions you have with your health care provider.
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
## 2015-03-23 ENCOUNTER — Emergency Department (HOSPITAL_COMMUNITY)
Admission: EM | Admit: 2015-03-23 | Discharge: 2015-03-23 | Disposition: A | Discharge status: Home / Self Care | Payer: Medicaid Other | Attending: Emergency Medicine | Admitting: Emergency Medicine

## 2015-03-23 ENCOUNTER — Emergency Department (HOSPITAL_COMMUNITY): Payer: Medicaid Other

## 2015-03-23 ENCOUNTER — Encounter (HOSPITAL_COMMUNITY): Payer: Self-pay | Admitting: Adult Health

## 2015-03-23 DIAGNOSIS — S8011XA Contusion of right lower leg, initial encounter: Secondary | ICD-10-CM | POA: Insufficient documentation

## 2015-03-23 DIAGNOSIS — T148XXA Other injury of unspecified body region, initial encounter: Secondary | ICD-10-CM

## 2015-03-23 DIAGNOSIS — Y92410 Unspecified street and highway as the place of occurrence of the external cause: Secondary | ICD-10-CM | POA: Insufficient documentation

## 2015-03-23 DIAGNOSIS — Y998 Other external cause status: Secondary | ICD-10-CM | POA: Diagnosis not present

## 2015-03-23 DIAGNOSIS — Z720 Tobacco use: Secondary | ICD-10-CM | POA: Diagnosis not present

## 2015-03-23 DIAGNOSIS — Y9355 Activity, bike riding: Secondary | ICD-10-CM | POA: Insufficient documentation

## 2015-03-23 DIAGNOSIS — G40909 Epilepsy, unspecified, not intractable, without status epilepticus: Secondary | ICD-10-CM | POA: Diagnosis not present

## 2015-03-23 DIAGNOSIS — Z791 Long term (current) use of non-steroidal anti-inflammatories (NSAID): Secondary | ICD-10-CM | POA: Insufficient documentation

## 2015-03-23 DIAGNOSIS — S8991XA Unspecified injury of right lower leg, initial encounter: Secondary | ICD-10-CM | POA: Diagnosis present

## 2015-03-23 MED ORDER — HYDROCODONE-ACETAMINOPHEN 5-325 MG PO TABS
1.0000 | ORAL_TABLET | Freq: Once | ORAL | Status: AC
  Administered 2015-03-23: 1 via ORAL
  Filled 2015-03-23: qty 1

## 2015-03-23 MED ORDER — HYDROCODONE-ACETAMINOPHEN 5-325 MG PO TABS: 1.0000 | ORAL_TABLET | Freq: Four times a day (QID) | ORAL | Status: DC | PRN
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
## 2015-03-23 NOTE — ED Notes (Signed)
Fell asleep immediately after arriving to the room.  No distress noted at this time.
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
## 2015-03-23 NOTE — Discharge Instructions (Signed)
Contusion °A contusion is a deep bruise. Contusions happen when an injury causes bleeding under the skin. Signs of bruising include pain, puffiness (swelling), and discolored skin. The contusion may turn blue, purple, or yellow. °HOME CARE  °· Put ice on the injured area. °¨ Put ice in a plastic bag. °¨ Place a towel between your skin and the bag. °¨ Leave the ice on for 15-20 minutes, 03-04 times a day. °· Only take medicine as told by your doctor. °· Rest the injured area. °· If possible, raise (elevate) the injured area to lessen puffiness. °GET HELP RIGHT AWAY IF:  °· You have more bruising or puffiness. °· You have pain that is getting worse. °· Your puffiness or pain is not helped by medicine. °MAKE SURE YOU:  °· Understand these instructions. °· Will watch your condition. °· Will get help right away if you are not doing well or get worse. °Document Released: 04/12/2008 Document Revised: 01/17/2012 Document Reviewed: 08/30/2011 °ExitCare® Patient Information ©2015 [employer]. This information is not intended to replace advice given to you by your health care provider. Make sure you discuss any questions you have with your health care provider. ° °

## 2015-04-16 DIAGNOSIS — R0789 Other chest pain: Secondary | ICD-10-CM | POA: Diagnosis not present

## 2015-04-16 DIAGNOSIS — Z79899 Other long term (current) drug therapy: Secondary | ICD-10-CM | POA: Insufficient documentation

## 2015-04-16 DIAGNOSIS — G40909 Epilepsy, unspecified, not intractable, without status epilepticus: Secondary | ICD-10-CM | POA: Diagnosis not present

## 2015-04-16 DIAGNOSIS — R001 Bradycardia, unspecified: Secondary | ICD-10-CM | POA: Insufficient documentation

## 2015-04-16 DIAGNOSIS — Z720 Tobacco use: Secondary | ICD-10-CM | POA: Insufficient documentation

## 2015-04-16 DIAGNOSIS — R079 Chest pain, unspecified: Secondary | ICD-10-CM | POA: Diagnosis present

## 2015-04-16 DIAGNOSIS — Z792 Long term (current) use of antibiotics: Secondary | ICD-10-CM | POA: Diagnosis not present

## 2015-04-17 ENCOUNTER — Emergency Department (HOSPITAL_COMMUNITY)
Admission: EM | Admit: 2015-04-17 | Discharge: 2015-04-17 | Disposition: A | Discharge status: Home / Self Care | Payer: Medicare Other | Attending: Emergency Medicine | Admitting: Emergency Medicine

## 2015-04-17 ENCOUNTER — Encounter (HOSPITAL_COMMUNITY): Payer: Self-pay | Admitting: Emergency Medicine

## 2015-04-17 ENCOUNTER — Emergency Department (HOSPITAL_COMMUNITY): Payer: Medicare Other

## 2015-04-17 DIAGNOSIS — R0789 Other chest pain: Secondary | ICD-10-CM | POA: Diagnosis not present

## 2015-04-17 LAB — BASIC METABOLIC PANEL
Anion gap: 9 (ref 5–15)
BUN: 16 mg/dL (ref 6–20)
CO2: 24 mmol/L (ref 22–32)
Calcium: 8.9 mg/dL (ref 8.9–10.3)
Chloride: 105 mmol/L (ref 101–111)
Creatinine, Ser: 0.98 mg/dL (ref 0.61–1.24)
GFR calc non Af Amer: >60 mL/min (ref >60)
Glucose, Bld: 76 mg/dL (ref 65–99)
Potassium: 3.6 mmol/L (ref 3.5–5.1)
Sodium: 138 mmol/L (ref 135–145)

## 2015-04-17 LAB — CBC
HCT: 41.5 % (ref 39.0–52.0)
Hemoglobin: 14.2 g/dL (ref 13.0–17.0)
MCH: 29.2 pg (ref 26.0–34.0)
MCHC: 34.2 g/dL (ref 30.0–36.0)
MCV: 85.4 fL (ref 78.0–100.0)
Platelets: 214 10*3/uL (ref 150–400)
RBC: 4.86 MIL/uL (ref 4.22–5.81)
RDW: 13.5 % (ref 11.5–15.5)
WBC: 6.8 10*3/uL (ref 4.0–10.5)

## 2015-04-17 LAB — I-STAT TROPONIN, ED: Troponin i, poc: 0.01 ng/mL (ref 0.00–0.08)

## 2015-04-17 MED ORDER — NAPROXEN 250 MG PO TABS
500.0000 mg | ORAL_TABLET | Freq: Once | ORAL | Status: AC
  Administered 2015-04-17: 500 mg via ORAL
  Filled 2015-04-17: qty 2

## 2015-04-17 MED ORDER — NAPROXEN 500 MG PO TABS: 500.0000 mg | ORAL_TABLET | Freq: Two times a day (BID) | ORAL | Status: DC

## 2015-04-17 NOTE — Discharge Instructions (Signed)
Chest Wall Pain °Chest wall pain is pain in or around the bones and muscles of your chest. It may take up to 6 weeks to get better. It may take longer if you must stay physically active in your work and activities.  °CAUSES  °Chest wall pain may happen on its own. However, it may be caused by: °· A viral illness like the flu. °· Injury. °· Coughing. °· Exercise. °· Arthritis. °· Fibromyalgia. °· Shingles. °HOME CARE INSTRUCTIONS  °· Avoid overtiring physical activity. Try not to strain or perform activities that cause pain. This includes any activities using your chest or your abdominal and side muscles, especially if heavy weights are used. °· Put ice on the sore area. °¨ Put ice in a plastic bag. °¨ Place a towel between your skin and the bag. °¨ Leave the ice on for 15-20 minutes per hour while awake for the first 2 days. °· Only take over-the-counter or prescription medicines for pain, discomfort, or fever as directed by your caregiver. °SEEK IMMEDIATE MEDICAL CARE IF:  °· Your pain increases, or you are very uncomfortable. °· You have a fever. °· Your chest pain becomes worse. °· You have new, unexplained symptoms. °· You have nausea or vomiting. °· You feel sweaty or lightheaded. °· You have a cough with phlegm (sputum), or you cough up blood. °MAKE SURE YOU:  °· Understand these instructions. °· Will watch your condition. °· Will get help right away if you are not doing well or get worse. °Document Released: 10/25/2005 Document Revised: 01/17/2012 Document Reviewed: 06/21/2011 °ExitCare® Patient Information ©2015 [employer]. This information is not intended to replace advice given to you by your health care provider. Make sure you discuss any questions you have with your health care provider. ° ° °Emergency Department Resource Guide °1) Find a Doctor and Pay Out of Pocket °Although you won't have to find out who is covered by your insurance plan, it is a good idea to ask around and get recommendations. You  will then need to call the office and see if the doctor you have chosen will accept you as a new patient and what types of options they offer for patients who are self-pay. Some doctors offer discounts or will set up payment plans for their patients who do not have insurance, but you will need to ask so you aren't surprised when you get to your appointment. ° °2) Contact Your Local Health Department °Not all health departments have doctors that can see patients for sick visits, but many do, so it is worth a call to see if yours does. If you don't know where your local health department is, you can check in your phone book. The CDC also has a tool to help you locate your state's health department, and many state websites also have listings of all of their local health departments. ° °3) Find a Walk-in Clinic °If your illness is not likely to be very severe or complicated, you may want to try a walk in clinic. These are popping up all over the country in pharmacies, drugstores, and shopping centers. They're usually staffed by nurse practitioners or physician assistants that have been trained to treat common illnesses and complaints. They're usually fairly quick and inexpensive. However, if you have serious medical issues or chronic medical problems, these are probably not your best option. ° °No Primary Care Doctor: °- Call [facility] at  832-8000 - they can help you locate a primary care doctor that    accepts your insurance, provides certain services, etc. °- Physician Referral Service- 1-800-533-3463 ° °Chronic Pain Problems: °Organization         Address  Phone   Notes  °[facility]  (336) 297-2271 Patients need to be referred by their primary care doctor.  ° °Medication Assistance: °Organization         Address  Phone   Notes  °[place] [facility] [address] °[place], [place] [address] (336) 641-8030 --Must be a resident of [place] °-- Must  have NO insurance coverage whatsoever (no Medicaid/ Medicare, etc.) °-- The pt. MUST have a primary care doctor that directs their care regularly and follows them in the community °  °[facility]  (866) 331-1348   °[employer]  (888) 892-1162   ° °Agencies that provide inexpensive medical care: °Organization         Address  Phone   Notes  °[facility]  (336) 832-8035   °[facility]    (336) 832-7272   °[facility] [address] °[place], [place] [address] (336) 832-4777   °[facility] [address], °[place] (336) 271-4999   °[facility]    (336) 373-0678   °[facility]    (336) 272-1050   °[facility] ° [address], [place] Phone:  (336) 832-4444, Fax:  (336) 832-4440 Hours of Operation:  9 am - 6 pm, M-F.  Also accepts Medicaid/Medicare and self-pay.  °[facility] ° [address], [place] Phone: (336) 832-3150, Fax: (336) [phone]. Hours of Operation:  8:30 am - 5:30 pm, M-F.  Also accepts Medicaid and self-pay.  °[facility] [place] [address], [place] Phone: (336) 878-6027   °[facility] [address], [place], [place] (336)723-1848, Ext. 123 Mondays & Thursdays: 7-9 AM.  First 15 patients are seen on a first come, first serve basis. °  ° °Medicaid-accepting [place] Providers: ° °Organization         Address  Phone   Notes  °[facility] [address], [place] (336) 641-2100 Also accepts self-pay patients.  °[facility] [address], [place] ° (336) 856-9996   °[facility] [address], [place] (336) 288-8857   °[facility] [address] [place] [address], [place] (336) 299-7000   °Veita Bland [address], [place]  ° (336) 373-1557 Only accepts [place] Access Medicaid patients after  they have their name applied to their card.  ° °Self-Pay (no insurance) in [place]: ° °Organization         Address  Phone   Notes  °Sickle Cell Patients, [facility] [address], [place] (336) 832-1970   °[facility] [address], [place] (336) 832-4400   °[facility] ° [address], [place] (336) 992-4800   °[facility]/Dr. Osei-Bonsu ° [address] [place] [address], [place] or [address], [place] (336) 841-8500 Phone number for both [place] and [place] locations is the same.  °[facility] [address], [place] (336) 299-0000   °[facility] [address] [place] [address],  or [address] (336) 852-7530 °(336) 878-2260   °[facility] [address], [place] (336) 350-1642, phone; (336) 294-5005, fax Sees patients 1st and 3rd Saturday of every month.  Must not qualify for public or private insurance (i.e. Medicaid, Medicare, [place] Health Choice, Veterans' Benefits) • Household income should be no more than 200% of the poverty level •The clinic cannot treat you if you are pregnant or think you are pregnant • Sexually transmitted diseases are not treated at the clinic.  ° ° °Dental Care: °Organization         Address  Phone  Notes  °[facility] [address], [place] (336) 641-6152 Accepts children up to age 21 who are enrolled in Medicaid or [place] Health Choice; pregnant women with a Medicaid card; and children who have applied for Medicaid or [place] Health Choice, but were declined, whose parents can pay a reduced fee at time of service.  °[facility] [place]  [address], [place] (336) 641-7733 Accepts children up to age 21 who are enrolled in Medicaid or [place] Health Choice; pregnant women with a Medicaid card; and children who  have applied for Medicaid or [place] Health Choice, but were declined, whose parents can pay a reduced fee at time of service.  °[facility] ° [address], [place] (336) 641-4533 Patients are seen by appointment only. Walk-ins are not accepted. [facility] will see patients 18 years of age and older. °Monday - Tuesday (8am-5pm) °Most Wednesdays (8:30-5pm) °$30 per visit, cash only  °[facility] ° [address], [place] (336) 641-4533 Patients are seen by appointment only. Walk-ins are not accepted. [facility] will see patients 18 years of age and older. °One Wednesday Evening (Monthly: Volunteer Based).  $30 per visit, cash only  °[facility]  (919) 537-3737 for adults; Children under age 4, call Graduate Pediatric Dentistry at ([phone]) [phone]. Children aged 4-14, please call (919) [phone] to request a pediatric application. ° Dental services are provided in all areas of dental care including fillings, crowns and bridges, complete and partial dentures, implants, gum treatment, root canals, and extractions. Preventive care is also provided. Treatment is provided to both adults and children. °Patients are selected via a lottery and there is often a waiting list. °  °[facility] [address], °[place] ° (336) 763-8833 [email or web address] °  °[facility] [address], [place], [place] (336)723-1848, Ext. 123 Second and Fourth Thursday of each month, opens at 6:30 AM; Clinic ends at 9 AM.  Patients are seen on a first-come first-served basis, and a limited number are seen during each clinic.  ° °[facility] ° [address], [place], [place] (336) 723-7904   Eligibility Requirements °You must have lived in [place], [place], or [place] counties for at least the last three months. °  You cannot be eligible for state or federal sponsored healthcare insurance, including Veterans  Administration, Medicaid, or Medicare. °  You generally cannot be eligible for healthcare insurance through your employer.  °  How to apply: °Eligibility screenings are held every Tuesday and Wednesday afternoon from 1:00 pm until 4:00 pm. You do not need an appointment for the interview!  °[facility] [address], [place],  336-631-2330   °[facility]  336-342-8273   °[facility]  336-703-3100   °[facility]    336-570-6415   ° °Behavioral Health Resources in the Community: °Intensive Outpatient Programs °Organization         Address  Phone  Notes  °[facility] [address], [place], [place] 336-878-6098   °[facility] [address], [place], [place] 336-832-9800   °[facility] [address], [place], [place] ° 336-882-2125   °[facility] [address],  °[place], [place] 1-800-853-5163 or 336-641-4981   °Substance Abuse Resources °Organization         Address  Phone  Notes  °[facility]  336-882-2125   °[facility]  336-784-9470   °[facility]  336-285-9073   °[facility]  336-845-3988   °Residential & Outpatient Substance Abuse Program  1-800-659-3381   °Psychological Services °Organization         Address  Phone  Notes  °[facility]  336- 832-9600   °[facility]  336- 378-7881   °[facility] [address], [place] 1-800-853-5163 or 336-641-4981   ° °Mobile Crisis Teams °Organization         Address  Phone  Notes  °[facility], Mobile Crisis Care Unit  1-877-626-1772   °Assertive °[facility] ° [address] [place], [place] 336-834-9664   °Sharon DeEsch [address] °[place] 336-554-5454   ° °Self-Help/Support Groups °Organization         Address  Phone             Notes  °[facility] -  variety of support groups  336- 373-1402 Call for more information  °Narcotics Anonymous (NA), [facility] [address], °[place]  2 meetings at this location  ° °Residential Treatment Programs °Organization         Address  Phone  Notes  °[facility] [address],    °[place]  1-866-801-8205   °[facility] ° [address], [place], [place] 704-293-8524   °[facility] [address], [place] 336-845-3988 Admissions: 8am-3pm M-F  °[facility] [address]    °[place], [place] 336-841-1104   °[facility] [address], [place], [place] 336-379-7146   °[facility] [address]  °[place], [place] 336-285-9073   °[facility] - Intensive Outpatient [address], [place], [place] 336-852-3033   °[facility] [address]  °[place], [place] 1-877-615-2722 or 336-784-9470   °[facility] [address] [place], [place] 336-227-7417 Accepts Medicaid  °[facility] [address]  ° [place] 1-800-659-3381 Substance Abuse/Addiction Treatment  ° °[place] Behavioral Health Resources °Organization         Address  Phone  Notes  °[facility]  (888) 581-9988   °Julie Brannon, PhD [address] [place], [place]   (336) 349-5553 or (336) 951-0000   °[facility]   [address] °[place], [place] (336) 349-4454   °[facility] [address], [place], [place] (336) 342-8316 Insurance/Medicaid/sponsorship through [facility]  °[facility] [address]                                    ,  (336) 342-8316 Therapy/tele-psych/case  °[facility] [address]  ° [place], [place] (336) 349-2233    °Dr. Arfeen  (336) 349-4544   °[facility] [place]  [employer] [place] [facility] 1) [address], [place] °2) [address], [place] °3)  [address], [place] (336) 349-3220 °(336) 342-7768 ° °(336) 342-8140   °[place] [facility] (336) 342-1394 or (336) 342-3537 (After Hours)    ° ° ° °

## 2015-04-17 NOTE — ED Provider Notes (Signed)
CSN: [id number]     Arrival date & time 04/16/15  2350 History   First MD Initiated Contact with Patient 04/17/15 0138     Chief Complaint  Patient presents with  . Chest Pain    (Consider location/radiation/quality/duration/timing/severity/associated sxs/prior Treatment) HPI Comments: Patient is a 39 year old male with a history of seizures who presents to the emergency department for further evaluation of chest pain. Patient states that chest pain began at 2000 yesterday evening. Pain has been intermittent and aching. He denies any radiation of the pain as well as any modifying factors. No medications taken prior to arrival to try and alleviate symptoms. He denies associated fever, syncope, leg swelling, shortness of breath, nausea, or vomiting. No family history of sudden cardiac death. He reports a history of ACS in his grandmother in her mid 78's. He is a 1ppd smoker. No PMHx of HTN, DM, HLD, ACS, or PE/DVT. No recent travel. 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  Patient is a 39 y.o. male presenting with chest pain. The history is provided by the patient. No language interpreter was used.  Chest Pain   Past Medical History  Diagnosis Date  . Seizures    History reviewed. No pertinent past surgical history. No family history on file. History  Substance Use Topics  . Smoking status: Current Every Day Smoker  . Smokeless tobacco: Not on file  . Alcohol Use: No    Review of Systems  Cardiovascular: Positive for chest pain.  All other systems reviewed and are negative.   Allergies  Review of patient's allergies indicates no known allergies.  Home Medications   Prior to Admission medications   Medication Sig Start Date End Date Taking? Authorizing Provider  cephALEXin (KEFLEX) 500 MG capsule Take 1 capsule (500 mg total) by mouth 4 (four) times daily. 03/21/15   Nicole Pisciotta, PA-C  divalproex  (DEPAKOTE) 500 MG DR tablet Take 1 tablet (500 mg total) by mouth daily. Patient not taking: Reported on 01/17/2015 02/26/14   Emilia Beck, PA-C  HYDROcodone-acetaminophen (NORCO/VICODIN) 5-325 MG per tablet Take 1 tablet by mouth every 6 (six) hours as needed. 03/23/15   Shon Baton, MD  ibuprofen (ADVIL,MOTRIN) 600 MG tablet Take 1 tablet (600 mg total) by mouth 3 (three) times daily after meals. 01/18/15   Loren Racer, MD  levETIRAcetam (KEPPRA) 500 MG tablet Take 1 tablet (500 mg total) by mouth 2 (two) times daily. 03/21/15   Nicole Pisciotta, PA-C  naproxen (NAPROSYN) 500 MG tablet Take 1 tablet (500 mg total) by mouth 2 (two) times daily. 04/17/15   Antony Madura, PA-C  permethrin (ELIMITE) 5 % cream Apply to affected area once Patient not taking: Reported on 01/17/2015 02/26/14   Emilia Beck, PA-C  phenytoin (DILANTIN) 100 MG ER capsule Take 1 capsule (100 mg total) by mouth 3 (three) times daily. Patient not taking: Reported on 01/17/2015 02/26/14   Kaitlyn Szekalski, PA-C   BP 115/50 mmHg  Pulse 51  Temp(Src) 98.4 F (36.9 C)  Resp 20  SpO2 96%   Physical Exam  Constitutional: He is oriented to person, place, and time. He appears well-developed and well-nourished. No distress.  Patient calm and well appearing. He seems sleepy.  HENT:  Head: Normocephalic and atraumatic.  Eyes: Conjunctivae and EOM are normal. No scleral icterus.  Neck: Normal range of motion.  Cardiovascular: Regular rhythm and intact distal pulses.  Bradycardia present.  Pulmonary/Chest: Effort normal and breath sounds normal. No respiratory distress. He has no wheezes. He has no rales. He exhibits tenderness.  Central chest wall TTP. No crepitus or deformity.  Abdominal: Soft. He exhibits no distension. There is no tenderness.  Soft, nontender  Musculoskeletal: Normal range of motion.  Neurological: He is alert and oriented to person, place, and time. He exhibits normal muscle tone. Coordination  normal.  GCS 15. Patient moves extremities without ataxia.  Skin: Skin is warm and dry. No rash noted. He is not diaphoretic. No erythema. No pallor.  Psychiatric: He has a normal mood and affect. His behavior is normal.  Nursing note and vitals reviewed.   ED Course  Procedures (including critical care time) Labs Review Labs Reviewed  CBC  BASIC METABOLIC PANEL  Rosezena Sensor, ED    Imaging Review Dg Chest 2 View  04/17/2015   CLINICAL DATA:  Midchest pain, onset tonight.  EXAM: CHEST  2 VIEW  COMPARISON:  01/17/2015  FINDINGS: There is mild hyperinflation. The lungs are clear. There are no pleural effusions. Pulmonary vasculature is normal. Heart size is normal, unchanged. Hilar and mediastinal contours are unremarkable and unchanged.  IMPRESSION: Mild hyperinflation.   Electronically Signed   By: Ellery Plunk M.D.   On: 04/17/2015 01:15    ED ECG REPORT   Date: 04/17/2015  Rate: 56  Rhythm: sinus bradycardia  QRS Axis: normal  Intervals: normal  ST/T Wave abnormalities: normal  Conduction Disutrbances:none  Narrative Interpretation: NSR. No STEMI or ischemic change  Old EKG Reviewed: none available    I have personally reviewed and interpreted this EKG   MDM   Final diagnoses:  Chest wall pain    Patient is a 39 year old male who presents to the emergency department for further evaluation of chest pain which began at 2000 yesterday. Patient reports that he is a one pack per day smoker; no other significant PMHx. No prior history of ACS and heart score is 1 consistent with low risk of acute coronary event. Laboratory workup today is noncontributory. Chest x-ray shows no pneumothorax, pleural effusion, mediastinal widening, or pneumonia. EKG is nonischemic.  Symptoms are reproducible on palpation today. Given that patient frequently lifts heavy car engines at work, his pain is likely secondary to a chest wall strain. Will manage with anti-inflammatories and  instruct patient to follow-up with a primary care provider. Resource guide given. Return precautions discussed and provided. Patient agreeable to plan with no unaddressed concerns. Patient discharged in good condition.   Filed Vitals:   04/17/15 0003 04/17/15 0140 04/17/15 0145  BP: 122/72 115/50 108/62  Pulse: 56 51 56  Temp: 98.4 F (36.9 C)    Resp: SpO2: 100% 96% 98%     Antony Madura, PA-C 04/17/15 0205  Devoria Albe, MD 04/17/15 [phone]

## 2015-04-17 NOTE — ED Notes (Signed)
Patient with chest pain that started approx one hour ago.  Patient denies any shortness of breath, nausea or vomiting.  The pain is in the center of his chest, no radiation.

## 2017-04-18 ENCOUNTER — Emergency Department (HOSPITAL_COMMUNITY)
Admission: EM | Admit: 2017-04-18 | Discharge: 2017-04-18 | Disposition: A | Discharge status: Home / Self Care | Payer: Medicare Other | Attending: Emergency Medicine | Admitting: Emergency Medicine

## 2017-04-18 ENCOUNTER — Encounter (HOSPITAL_COMMUNITY): Payer: Self-pay

## 2017-04-18 DIAGNOSIS — X16XXXA Contact with hot heating appliances, radiators and pipes, initial encounter: Secondary | ICD-10-CM | POA: Diagnosis not present

## 2017-04-18 DIAGNOSIS — F17210 Nicotine dependence, cigarettes, uncomplicated: Secondary | ICD-10-CM | POA: Insufficient documentation

## 2017-04-18 DIAGNOSIS — Y9289 Other specified places as the place of occurrence of the external cause: Secondary | ICD-10-CM | POA: Insufficient documentation

## 2017-04-18 DIAGNOSIS — Y999 Unspecified external cause status: Secondary | ICD-10-CM | POA: Insufficient documentation

## 2017-04-18 DIAGNOSIS — T22111A Burn of first degree of right forearm, initial encounter: Secondary | ICD-10-CM

## 2017-04-18 DIAGNOSIS — Y9389 Activity, other specified: Secondary | ICD-10-CM | POA: Diagnosis not present

## 2017-04-18 LAB — CBG MONITORING, ED: Glucose-Capillary: 98 mg/dL (ref 65–99)

## 2017-04-18 MED ORDER — SILVER SULFADIAZINE 1 % EX CREA: 1.0000 "application " | TOPICAL_CREAM | Freq: Every day | CUTANEOUS | 0 refills | Status: DC

## 2017-04-18 MED ORDER — SILVER SULFADIAZINE 1 % EX CREA
TOPICAL_CREAM | Freq: Once | CUTANEOUS | Status: AC
  Administered 2017-04-18: 16:00:00 via TOPICAL
  Filled 2017-04-18: qty 85

## 2017-04-18 MED ORDER — TETANUS-DIPHTH-ACELL PERTUSSIS 5-2.5-18.5 LF-MCG/0.5 IM SUSP
0.5000 mL | Freq: Once | INTRAMUSCULAR | Status: AC
  Administered 2017-04-18: 0.5 mL via INTRAMUSCULAR
  Filled 2017-04-18: qty 0.5

## 2017-04-18 NOTE — Discharge Instructions (Signed)
Please read and follow all provided instructions.  Your diagnoses today include:  1. Superficial burn of right forearm, initial encounter     Tests performed today include: Vital signs. See below for your results today.   Medications prescribed:  Take as prescribed   Home care instructions:  Follow any educational materials contained in this packet.  Follow-up instructions: Please follow-up with your primary care provider for further evaluation of symptoms and treatment   Return instructions:  Please return to the Emergency Department if you do not get better, if you get worse, or new symptoms OR  - Fever (temperature greater than 101.26F)  - Bleeding that does not stop with holding pressure to the area    -Severe pain (please note that you may be more sore the day after your accident)  - Chest Pain  - Difficulty breathing  - Severe nausea or vomiting  - Inability to tolerate food and liquids  - Passing out  - Skin becoming red around your wounds  - Change in mental status (confusion or lethargy)  - New numbness or weakness    Please return if you have any other emergent concerns.  Additional Information:  Your vital signs today were: BP 115/74 (BP Location: Left Arm)    Pulse 66    Temp 98.7 F (37.1 C) (Oral)    Resp 18    Ht 5\' 6"  (1.676 m)    Wt 81.6 kg (180 lb)    SpO2 94%    BMI 29.05 kg/m  If your blood pressure (BP) was elevated above 135/85 this visit, please have this repeated by your doctor within one month. ---------------

## 2017-04-18 NOTE — ED Provider Notes (Signed)
[facility]-EMERGENCY DEPT Provider Note   CSN: [id number] Arrival date & time: 04/18/17  1359  By signing my name below, I, William Walls, attest that this documentation has been prepared under the direction and in the presence of [name], PA-C. Electronically Signed: Thelma BargeNick Walls, Scribe. 04/18/17. 3:27 PM.  History   Chief Complaint Chief Complaint  Patient presents with  . Chemical Burn  The history is provided by the patient. No language interpreter was used.   HPI Comments: William Walls is a 41 y.o. male with PMHx of DM who presents to the Emergency Department complaining of constant, rapid-onset right-sided 1/10 arm pain s/p a thermal burn that occurred PTA. He states he took the cap off of his radiator and hot antifreeze fell on his right arm. He has associated color changes and skin tightness to the area. He tried to rinse it with water and blow air on it with temporary and mild to no relief. His tetanus is not UTD. No numbness/tingling. Motor/senastion intact. No other symptoms noted.   Past Medical History:  Diagnosis Date  . Seizures (HCC)     There are no active problems to display for this patient.   History reviewed. No pertinent surgical history.     Home Medications    Prior to Admission medications   Medication Sig Start Date End Date Taking? Authorizing Provider  cephALEXin (KEFLEX) 500 MG capsule Take 1 capsule (500 mg total) by mouth 4 (four) times daily. 03/21/15   Pisciotta, Joni ReiningNicole, PA-C  divalproex (DEPAKOTE) 500 MG DR tablet Take 1 tablet (500 mg total) by mouth daily. Patient not taking: Reported on 01/17/2015 02/26/14   Emilia BeckSzekalski, Kaitlyn, PA-C  HYDROcodone-acetaminophen (NORCO/VICODIN) 5-325 MG per tablet Take 1 tablet by mouth every 6 (six) hours as needed. 03/23/15   Horton, Mayer Maskerourtney F, MD  ibuprofen (ADVIL,MOTRIN) 600 MG tablet Take 1 tablet (600 mg total) by mouth 3 (three) times daily after meals. 01/18/15   Loren RacerYelverton, David, MD  levETIRAcetam (KEPPRA)  500 MG tablet Take 1 tablet (500 mg total) by mouth 2 (two) times daily. 03/21/15   Pisciotta, Joni ReiningNicole, PA-C  naproxen (NAPROSYN) 500 MG tablet Take 1 tablet (500 mg total) by mouth 2 (two) times daily. 04/17/15   Antony MaduraHumes, Kelly, PA-C  permethrin (ELIMITE) 5 % cream Apply to affected area once Patient not taking: Reported on 01/17/2015 02/26/14   Emilia BeckSzekalski, Kaitlyn, PA-C  phenytoin (DILANTIN) 100 MG ER capsule Take 1 capsule (100 mg total) by mouth 3 (three) times daily. Patient not taking: Reported on 01/17/2015 02/26/14   Emilia BeckSzekalski, Kaitlyn, PA-C    Family History No family history on file.  Social History Social History  Substance Use Topics  . Smoking status: Current Every Day Smoker    Packs/day: 1.00    Types: Cigarettes  . Smokeless tobacco: Never Used  . Alcohol use No     Allergies   Patient has no known allergies.   Review of Systems Review of Systems  Skin: Positive for color change and wound.  Neurological: Negative for weakness.     Physical Exam Updated Vital Signs BP 115/74 (BP Location: Left Arm)   Pulse 66   Temp 98.7 F (37.1 C) (Oral)   Resp 18   Ht 5\' 6"  (1.676 m)   Wt 180 lb (81.6 kg)   SpO2 94%   BMI 29.05 kg/m   Physical Exam  Constitutional: He is oriented to person, place, and time. He appears well-developed and well-nourished.  HENT:  Head: Normocephalic and  atraumatic.  Cardiovascular: Normal rate and regular rhythm.   Pulmonary/Chest: Effort normal and breath sounds normal. No respiratory distress.  Neurological: He is alert and oriented to person, place, and time.  Skin: Skin is warm and dry.  Right arm: diffuse reddening of the forearm with a central grouping of fluid-filled vessicles  Up to the elbow and wrapping around to the anterior forearm. Strong radial pulses, good blood flow, sensation intact, 1/10 pain, burning  Psychiatric: He has a normal mood and affect.  Nursing note and vitals reviewed.  ED Treatments / Results  DIAGNOSTIC  STUDIES: Oxygen Saturation is 94% on RA, adequate by my interpretation.    COORDINATION OF CARE: 3:26 PM Discussed treatment plan with pt at bedside and pt agreed to plan. Labs (all labs ordered are listed, but only abnormal results are displayed) Labs Reviewed  CBG MONITORING, ED    EKG  EKG Interpretation None       Radiology No results found.  Procedures Procedures (including critical care time)  Medications Ordered in ED Medications - No data to display   Initial Impression / Assessment and Plan / ED Course  I have reviewed the triage vital signs and the nursing notes.  Pertinent labs & imaging results that were available during my care of the patient were reviewed by me and considered in my medical decision making (see chart for details).  Final Clinical Impressions(s) / ED Diagnoses     {I have reviewed the relevant previous healthcare records. {I obtained HPI from historian.   ED Course:  Assessment: Pt is a 41 y.o. male who presents with 1st degree burn to right forearm 2/2 hot antifreeze after taking radiator cap off when engine was still hot. Exploded and landed on arm. Cooled with water. On exam, pt in NAD. Nontoxic/nonseptic appearing. VSS. Afebrile. Right forearm with small fluid filled vesicles that are intact. Mild erythema. 1st degree burn. Given silvadene in ED. Plan is to DC home with follow up to PCP. At time of discharge, Patient is in no acute distress. Vital Signs are stable. Patient is able to ambulate. Patient able to tolerate PO.   Disposition/Plan:  DC Home Additional Verbal discharge instructions given and discussed with patient.  Pt Instructed to f/u with PCP in the next week for evaluation and treatment of symptoms. Return precautions given Pt acknowledges and agrees with plan  Supervising Physician Linwood Dibbles, MD  Final diagnoses:  Superficial burn of right forearm, initial encounter    New Prescriptions New Prescriptions   No  medications on file   I personally performed the services described in this documentation, which was scribed in my presence. The recorded information has been reviewed and is accurate.    Audry Pili, PA-C 04/18/17 1550    Linwood Dibbles, MD 04/20/17 [phone]

## 2017-04-18 NOTE — ED Triage Notes (Signed)
Per Pt, Pt reports trying to take care of his car when he went to change the antifreeze in his radiator. The Anti-freeze burst out of the valve when he opened it and it burned his right arm. Small places noted of first degree burn. Pt reports washing with water.

## 2017-04-19 ENCOUNTER — Emergency Department (HOSPITAL_COMMUNITY)
Admission: EM | Admit: 2017-04-19 | Discharge: 2017-04-19 | Disposition: A | Discharge status: Home / Self Care | Payer: Medicare Other | Attending: Emergency Medicine | Admitting: Emergency Medicine

## 2017-04-19 ENCOUNTER — Encounter (HOSPITAL_COMMUNITY): Payer: Self-pay | Admitting: *Deleted

## 2017-04-19 DIAGNOSIS — F17210 Nicotine dependence, cigarettes, uncomplicated: Secondary | ICD-10-CM | POA: Insufficient documentation

## 2017-04-19 DIAGNOSIS — Y929 Unspecified place or not applicable: Secondary | ICD-10-CM | POA: Insufficient documentation

## 2017-04-19 DIAGNOSIS — Y939 Activity, unspecified: Secondary | ICD-10-CM | POA: Diagnosis not present

## 2017-04-19 DIAGNOSIS — T22211A Burn of second degree of right forearm, initial encounter: Secondary | ICD-10-CM | POA: Insufficient documentation

## 2017-04-19 DIAGNOSIS — Y999 Unspecified external cause status: Secondary | ICD-10-CM | POA: Insufficient documentation

## 2017-04-19 DIAGNOSIS — X16XXXA Contact with hot heating appliances, radiators and pipes, initial encounter: Secondary | ICD-10-CM | POA: Insufficient documentation

## 2017-04-19 DIAGNOSIS — Z5321 Procedure and treatment not carried out due to patient leaving prior to being seen by health care provider: Secondary | ICD-10-CM | POA: Insufficient documentation

## 2017-04-19 NOTE — ED Notes (Signed)
Pt was seen yesterday, presents with silvadene cream to arm. Area cleaned and wrapped. Pt now also requesting AIDS testing as well as Dilantin levels to be checked

## 2017-04-19 NOTE — ED Notes (Signed)
At the desk requesting to leave.  States I got a new dressing so I'm going to leave.  Encouraged to stay without success.  Encouraged to return as needed.

## 2017-04-19 NOTE — ED Triage Notes (Signed)
Pt was removing a radiator cap yesterday when the cap flew off and radiator fluid got onto R forearm. Redness noted from wrist to upper arm. Pt reports a blister ruptured today from site; pus noted from area, small blisters noted to inner forearm. Pt also requesting to have Dilantin levels check because pt has been off of medications for over a year, also requesting a neurology referral

## 2017-04-25 ENCOUNTER — Encounter (HOSPITAL_COMMUNITY): Payer: Self-pay

## 2017-04-25 ENCOUNTER — Emergency Department (HOSPITAL_COMMUNITY)
Admission: EM | Admit: 2017-04-25 | Discharge: 2017-04-26 | Disposition: A | Discharge status: Home / Self Care | Payer: Medicare Other | Attending: Emergency Medicine | Admitting: Emergency Medicine

## 2017-04-25 DIAGNOSIS — F17210 Nicotine dependence, cigarettes, uncomplicated: Secondary | ICD-10-CM | POA: Diagnosis not present

## 2017-04-25 DIAGNOSIS — E119 Type 2 diabetes mellitus without complications: Secondary | ICD-10-CM | POA: Insufficient documentation

## 2017-04-25 DIAGNOSIS — R55 Syncope and collapse: Secondary | ICD-10-CM

## 2017-04-25 HISTORY — DX: Type 2 diabetes mellitus without complications: E11.9

## 2017-04-25 LAB — URINALYSIS, ROUTINE W REFLEX MICROSCOPIC
BACTERIA UA: NONE SEEN
Glucose, UA: NEGATIVE mg/dL
HGB URINE DIPSTICK: NEGATIVE
Ketones, ur: NEGATIVE mg/dL
Leukocytes, UA: NEGATIVE
NITRITE: NEGATIVE
Protein, ur: 30 mg/dL — AB
Specific Gravity, Urine: 1.032 — ABNORMAL HIGH (ref 1.005–1.030)
Squamous Epithelial / LPF: NONE SEEN
pH: 5 (ref 5.0–8.0)

## 2017-04-25 LAB — CBC
HCT: 40.4 % (ref 39.0–52.0)
HEMOGLOBIN: 13.9 g/dL (ref 13.0–17.0)
MCH: 29.5 pg (ref 26.0–34.0)
MCHC: 34.4 g/dL (ref 30.0–36.0)
MCV: 85.8 fL (ref 78.0–100.0)
Platelets: 225 10*3/uL (ref 150–400)
RBC: 4.71 MIL/uL (ref 4.22–5.81)
RDW: 13.1 % (ref 11.5–15.5)
WBC: 6.5 10*3/uL (ref 4.0–10.5)

## 2017-04-25 LAB — CBG MONITORING, ED
GLUCOSE-CAPILLARY: 101 mg/dL — AB (ref 65–99)
Glucose-Capillary: 78 mg/dL (ref 65–99)
Glucose-Capillary: 112 mg/dL — ABNORMAL HIGH (ref 65–99)
Glucose-Capillary: 88 mg/dL (ref 65–99)
Glucose-Capillary: 100 mg/dL — ABNORMAL HIGH (ref 65–99)
Glucose-Capillary: 86 mg/dL (ref 65–99)

## 2017-04-25 LAB — RAPID URINE DRUG SCREEN, HOSP PERFORMED
Amphetamines: NOT DETECTED
BENZODIAZEPINES: NOT DETECTED
Barbiturates: NOT DETECTED
Cocaine: NOT DETECTED
Opiates: NOT DETECTED
Tetrahydrocannabinol: POSITIVE — AB

## 2017-04-25 LAB — I-STAT TROPONIN, ED: TROPONIN I, POC: 0 ng/mL (ref 0.00–0.08)

## 2017-04-25 LAB — BASIC METABOLIC PANEL
Anion gap: 9 (ref 5–15)
BUN: 14 mg/dL (ref 6–20)
CHLORIDE: 106 mmol/L (ref 101–111)
CO2: 24 mmol/L (ref 22–32)
Calcium: 9 mg/dL (ref 8.9–10.3)
Creatinine, Ser: 1.12 mg/dL (ref 0.61–1.24)
GFR calc Af Amer: >60 mL/min (ref >60)
GFR calc non Af Amer: >60 mL/min (ref >60)
GLUCOSE: 81 mg/dL (ref 65–99)
POTASSIUM: 3.7 mmol/L (ref 3.5–5.1)
Sodium: 139 mmol/L (ref 135–145)

## 2017-04-25 MED ORDER — DEXTROSE 50 % IV SOLN
INTRAVENOUS | Status: AC
  Filled 2017-04-25: qty 50

## 2017-04-25 NOTE — ED Notes (Signed)
ED Provider at bedside. 

## 2017-04-25 NOTE — ED Triage Notes (Addendum)
Pt endorses riding in a car and becoming dizzy and passing out 20 minutes pta. Pt's girlfriend gave pt orange juice and a soda with sugar packs. Pt still complains of dizziness. CBG 78 in triage. Orange juice with peanut butter and crackers given. Pt also has hx of seizures and has no been off of his meds x 2 years and hx of parkinson's.

## 2017-04-25 NOTE — ED Notes (Addendum)
While Pt in triage, pt became diaphoretic and unresponsive, episode lasted 30 seconds. Pt now alert and eating peanut butter and drinking oranje juice. Consulting civil engineerCharge RN notified. IV access gained by this RN

## 2017-04-25 NOTE — ED Provider Notes (Signed)
[facility]-EMERGENCY DEPT Provider Note   CSN: [id number] Arrival date & time: 04/25/17  2118     History   Chief Complaint Chief Complaint  Patient presents with  . Loss of Consciousness  . Hypoglycemia    HPI William Walls is a 41 y.o. male.  HPI  Patient is a 41 year old male past medical history significant for seizures, diabetes, who presents to the emergency department following a presyncopal episode. Patient states that he was riding in the car when he started became dizzy and lightheaded. Was able to pull over and have another person driving the vehicle for him. States he was sitting in the backseat when he became lightheaded and felt like he was going to pass out. His wife was with him, states that he was able to respond to his name. She gave him orange juice, peanut butter crackers and sugar. States that his mental status improved following some sugar. The patient was waiting in the waiting room patient had a second episode where he felt dizzy like he was going to pass out. Again improved after eating peanut butter. Denies preceding headache, change in vision, chest pain, shortness of breath, diaphoresis. No exertional syncope. No family history of sudden cardiac death. Patient states that he is not currently on any medications on a daily basis. States he has not seen a neurologist since 1993.  Past Medical History:  Diagnosis Date  . Diabetes mellitus without complication (HCC)   . Seizures (HCC)     There are no active problems to display for this patient.   History reviewed. No pertinent surgical history.     Home Medications    Prior to Admission medications   Not on File    Family History History reviewed. No pertinent family history.  Social History Social History  Substance Use Topics  . Smoking status: Current Every Day Smoker    Packs/day: 1.00    Types: Cigarettes  . Smokeless tobacco: Never Used  . Alcohol use No     Allergies   Patient has no  known allergies.   Review of Systems Review of Systems  Constitutional: Negative for appetite change, diaphoresis and fever.  HENT: Negative for congestion.   Eyes: Negative for visual disturbance.  Respiratory: Negative for chest tightness and shortness of breath.   Cardiovascular: Negative for chest pain and palpitations.  Gastrointestinal: Negative for abdominal pain, blood in stool, nausea and vomiting.  Genitourinary: Negative for decreased urine volume and dysuria.  Musculoskeletal: Negative for back pain.  Skin: Negative for rash.  Neurological: Positive for syncope and light-headedness. Negative for dizziness, seizures, weakness and headaches.  Psychiatric/Behavioral: Negative for behavioral problems.     Physical Exam Updated Vital Signs BP (!) 135/91   Pulse (!) 56   Temp 98.3 F (36.8 C) (Oral)   Resp (!) 23   Ht 5\' 6"  (1.676 m)   Wt 81.6 kg (180 lb)   SpO2 98%   BMI 29.05 kg/m   Physical Exam  Constitutional: He is oriented to person, place, and time. He appears well-developed and well-nourished. No distress.  HENT:  Head: Atraumatic.  Mouth/Throat: Oropharynx is clear and moist.  Eyes: Conjunctivae and EOM are normal.  Neck: Normal range of motion. Neck supple.  Cardiovascular: Normal rate, regular rhythm, normal heart sounds and intact distal pulses.   No murmur heard. Pulmonary/Chest: Effort normal and breath sounds normal. No respiratory distress.  Abdominal: Soft. He exhibits no distension. There is no tenderness.  Musculoskeletal: Normal range of motion.  Neurological: He is alert and oriented to person, place, and time. He has normal strength and normal reflexes. No cranial nerve deficit or sensory deficit. GCS eye subscore is 4. GCS verbal subscore is 5. GCS motor subscore is 6.  Skin: Skin is warm. Capillary refill takes less than 2 seconds.  Psychiatric: He has a normal mood and affect.     ED Treatments / Results  Labs (all labs ordered are  listed, but only abnormal results are displayed) Labs Reviewed  URINALYSIS, ROUTINE W REFLEX MICROSCOPIC - Abnormal; Notable for the following:       Result Value   Color, Urine AMBER (*)    APPearance HAZY (*)    Specific Gravity, Urine 1.032 (*)    Bilirubin Urine SMALL (*)    Protein, ur 30 (*)    All other components within normal limits  RAPID URINE DRUG SCREEN, HOSP PERFORMED - Abnormal; Notable for the following:    Tetrahydrocannabinol POSITIVE (*)    All other components within normal limits  CBG MONITORING, ED - Abnormal; Notable for the following:    Glucose-Capillary 112 (*)    All other components within normal limits  CBG MONITORING, ED - Abnormal; Notable for the following:    Glucose-Capillary 101 (*)    All other components within normal limits  CBG MONITORING, ED - Abnormal; Notable for the following:    Glucose-Capillary 100 (*)    All other components within normal limits  BASIC METABOLIC PANEL  CBC  CBG MONITORING, ED  CBG MONITORING, ED  I-STAT TROPOININ, ED  CBG MONITORING, ED    EKG  EKG Interpretation  Date/Time:  Monday April 25 2017 21:39:10 EDT Ventricular Rate:  63 PR Interval:  144 QRS Duration: 74 QT Interval:  406 QTC Calculation: 415 R Axis:   15 Text Interpretation:  Normal sinus rhythm Nonspecific ST and T wave abnormality Abnormal ECG inverted T waves similar to june 2016 ST changes similar as well Confirmed by Marily MemosMesner, Jason [phone]) on 04/25/2017 9:45:50 PM       Radiology No results found.  Procedures Procedures (including critical care time)  Medications Ordered in ED Medications - No data to display   Initial Impression / Assessment and Plan / ED Course  I have reviewed the triage vital signs and the nursing notes.  Pertinent labs & imaging results that were available during my care of the patient were reviewed by me and considered in my medical decision making (see chart for details).    Patient is a 41 year old male  past medical history significant for seizure disorder and diabetes, who presents to the emergency Department following a presyncopal episode. No preceding symptoms. Currently states he feels fine. Episodes improved with glucose. Afebrile, hemodynamically stable. One episode of presyncope in the waiting room, glucose 72. Nonfocal neurologic exam. No murmur. Benign abdominal exam.  EKG showed nonspecific ST changes, normal sinus rhythm, normal intervals, no chamber enlargement, no change when compared to prior EKG. No findings to suggest Brugada syndrome. Cardiac monitoring in the emergency department did not reveal any tachycardic or dysrhythmia. No exertional chest pain, no EKG findings to suggest HOCM.   UDS + THC. UA showed no signs of infectious process. Laboratory is not consistent with dehydration. No significant electrolyte abnormalities. No leukocytosis. Hemoglobin stable. Glucose 88, 112, 101, 100.  Called into the patient's room due to concern for unresponsiveness. States similar to the prior 2 episodes. Patient moving all extremities to noxious stimuli, responsive to his name. No  nystagmus. Glucose 100. Patient was on the monitor, sinus 56, normotensive. Do not feel that the patient is having actual episodes of unresponsiveness. Patient monitored during these episodes without signs of dysrhythmia. Patient discussing with his girlfriend earlier in the room, they are homeless and discussing the best hospitals to stay at. Patient with no episodes of hypoglycemia was in the emergency department.  Patient stable for discharge home. Discussed follow-up with the primary care physician, given information to establish care. Given strict return precautions to the emergency department. Patient expressed understanding, no questions or concerns at time of discharge.  Final Clinical Impressions(s) / ED Diagnoses   Final diagnoses:  Near syncope    New Prescriptions Current Discharge Medication List         Corena Herter, MD 04/25/17 1610    Marily Memos, MD 04/27/17 [phone]

## 2018-07-06 ENCOUNTER — Emergency Department (HOSPITAL_COMMUNITY)
Admission: EM | Admit: 2018-07-06 | Discharge: 2018-07-06 | Disposition: A | Discharge status: Home / Self Care | Payer: Medicare Other | Attending: Emergency Medicine | Admitting: Emergency Medicine

## 2018-07-06 ENCOUNTER — Encounter (HOSPITAL_COMMUNITY): Payer: Self-pay | Admitting: Emergency Medicine

## 2018-07-06 DIAGNOSIS — E119 Type 2 diabetes mellitus without complications: Secondary | ICD-10-CM | POA: Insufficient documentation

## 2018-07-06 DIAGNOSIS — F17210 Nicotine dependence, cigarettes, uncomplicated: Secondary | ICD-10-CM | POA: Diagnosis not present

## 2018-07-06 DIAGNOSIS — R55 Syncope and collapse: Secondary | ICD-10-CM

## 2018-07-06 LAB — CBC
HCT: 44 % (ref 39.0–52.0)
Hemoglobin: 14.4 g/dL (ref 13.0–17.0)
MCH: 29.3 pg (ref 26.0–34.0)
MCHC: 32.7 g/dL (ref 30.0–36.0)
MCV: 89.4 fL (ref 78.0–100.0)
PLATELETS: 227 10*3/uL (ref 150–400)
RBC: 4.92 MIL/uL (ref 4.22–5.81)
RDW: 13.7 % (ref 11.5–15.5)
WBC: 6.6 10*3/uL (ref 4.0–10.5)

## 2018-07-06 LAB — URINALYSIS, ROUTINE W REFLEX MICROSCOPIC
Bilirubin Urine: NEGATIVE
Glucose, UA: NEGATIVE mg/dL
Hgb urine dipstick: NEGATIVE
KETONES UR: NEGATIVE mg/dL
LEUKOCYTES UA: NEGATIVE
NITRITE: NEGATIVE
PROTEIN: NEGATIVE mg/dL
Specific Gravity, Urine: 1.032 — ABNORMAL HIGH (ref 1.005–1.030)
pH: 5 (ref 5.0–8.0)

## 2018-07-06 LAB — BASIC METABOLIC PANEL
Anion gap: 7 (ref 5–15)
BUN: 16 mg/dL (ref 6–20)
CO2: 27 mmol/L (ref 22–32)
CREATININE: 1.14 mg/dL (ref 0.61–1.24)
Calcium: 8.8 mg/dL — ABNORMAL LOW (ref 8.9–10.3)
Chloride: 105 mmol/L (ref 98–111)
Glucose, Bld: 76 mg/dL (ref 70–99)
POTASSIUM: 4.2 mmol/L (ref 3.5–5.1)
SODIUM: 139 mmol/L (ref 135–145)

## 2018-07-06 LAB — CBG MONITORING, ED: Glucose-Capillary: 96 mg/dL (ref 70–99)

## 2018-07-06 MED ORDER — SODIUM CHLORIDE 0.9 % IV BOLUS
1000.0000 mL | Freq: Once | INTRAVENOUS | Status: AC
  Administered 2018-07-06: 1000 mL via INTRAVENOUS

## 2018-07-06 NOTE — ED Provider Notes (Signed)
[facility] EMERGENCY DEPARTMENT Provider Note   CSN: [id number] Arrival date & time: 07/06/18  1712     History   Chief Complaint Chief Complaint  Patient presents with  . Loss of Consciousness    HPI William Walls is a 42 y.o. male.  83-year-old male brought in by ambulance after a syncopal event.  Patient states he was walking and his feet were bothering him with pins-and-needles.  He sat down and started getting headache and then he had about a minute of syncope.  His wife says his eyes were twitching but he did not have any full-blown seizure activity.  She said lasted about a minute and he woke up and was aware of what was going on.  She gave him a sugary drink thinking his blood sugar might be low.  EMS found him with a blood sugar of 99.  He states he was diagnosed with diabetes but is not on any medication for it.  He does not see a doctor.  [name] he had one prior syncopal event a year ago.  No chest pain no shortness of breath no fevers no chills.  Denies any alcohol or drugs.  The history is provided by the patient and the spouse.  Loss of Consciousness   This is a recurrent problem. The current episode started less than 1 hour ago. The problem has been resolved. Length of episode of loss of consciousness: 1 min. The problem is associated with normal activity. Associated symptoms include headaches. Pertinent negatives include abdominal pain, back pain, bladder incontinence, bowel incontinence, chest pain, confusion, diaphoresis, dizziness, fever, focal sensory loss, focal weakness, light-headedness, nausea, seizures, slurred speech, vertigo, visual change, vomiting and weakness. He has tried sugar/glucose for the symptoms. The treatment provided significant relief. His past medical history is significant for DM and seizures.    Past Medical History:  Diagnosis Date  . Diabetes mellitus without complication (HCC)   . Seizures (HCC)     There are no active  problems to display for this patient.   History reviewed. No pertinent surgical history.      Home Medications    Prior to Admission medications   Not on File    Family History History reviewed. No pertinent family history.  Social History Social History   Tobacco Use  . Smoking status: Current Every Day Smoker    Packs/day: 1.00    Types: Cigarettes  . Smokeless tobacco: Never Used  Substance Use Topics  . Alcohol use: No  . Drug use: No     Allergies   Patient has no known allergies.   Review of Systems Review of Systems  Constitutional: Negative for diaphoresis and fever.  HENT: Negative for sore throat.   Eyes: Negative for visual disturbance.  Respiratory: Negative for shortness of breath.   Cardiovascular: Positive for syncope. Negative for chest pain.  Gastrointestinal: Negative for abdominal pain, bowel incontinence, nausea and vomiting.  Genitourinary: Negative for bladder incontinence and dysuria.  Musculoskeletal: Negative for back pain.  Skin: Negative for rash.  Neurological: Positive for syncope and headaches. Negative for dizziness, vertigo, focal weakness, seizures, weakness and light-headedness.  Psychiatric/Behavioral: Negative for confusion.     Physical Exam Updated Vital Signs BP 129/77 (BP Location: Left Arm)   Pulse (!) 55   Temp 98.7 F (37.1 C) (Oral)   Resp 16   SpO2 97%   Physical Exam  Constitutional: He is oriented to person, place, and time. He appears well-developed and well-nourished.  HENT:  Head: Normocephalic and atraumatic.  Eyes: Conjunctivae are normal.  Neck: Neck supple.  Cardiovascular: Normal rate, regular rhythm and normal heart sounds.  No murmur heard. Pulmonary/Chest: Effort normal and breath sounds normal. No respiratory distress.  Abdominal: Soft. There is no tenderness.  Musculoskeletal: Normal range of motion. He exhibits no edema, tenderness or deformity.  Neurological: He is alert and oriented  to person, place, and time. He has normal strength. No cranial nerve deficit or sensory deficit. GCS eye subscore is 4. GCS verbal subscore is 5. GCS motor subscore is 6.  Skin: Skin is warm and dry.  Psychiatric: He has a normal mood and affect.  Nursing note and vitals reviewed.    ED Treatments / Results  Labs (all labs ordered are listed, but only abnormal results are displayed) Labs Reviewed  BASIC METABOLIC PANEL - Abnormal; Notable for the following components:      Result Value   Calcium 8.8 (*)    All other components within normal limits  URINALYSIS, ROUTINE W REFLEX MICROSCOPIC - Abnormal; Notable for the following components:   Specific Gravity, Urine 1.032 (*)    All other components within normal limits  CBC  CBG MONITORING, ED    EKG EKG Interpretation  Date/Time:  Thursday July 06 2018 17:26:43 EDT Ventricular Rate:  58 PR Interval:    QRS Duration: 83 QT Interval:  422 QTC Calculation: 415 R Axis:   -13 Text Interpretation:  Sinus rhythm Abnormal R-wave progression, early transition Borderline T abnormalities, inferior leads similar to prior 6/18 Confirmed by Meridee ScoreButler, Shruthi Northrup [phone]([id number]) on 07/06/2018 5:29:25 PM   Radiology No results found.  Procedures Procedures (including critical care time)  Medications Ordered in ED Medications  sodium chloride 0.9 % bolus 1,000 mL (0 mLs Intravenous Stopped 07/06/18 2020)     Initial Impression / Assessment and Plan / ED Course  I have reviewed the triage vital signs and the nursing notes.  Pertinent labs & imaging results that were available during my care of the patient were reviewed by me and considered in my medical decision making (see chart for details).  Clinical Course as of Jul 08 931  Thu Jul 06, 2018  [id number] 42 year old male with prior history of seizures and diabetes here with a syncopal event after walking.  Said he was preceded by feeling pins-and-needles in his feet and a headache.  Lasted about a  minute and he spontaneously returned to normal mental status.  His wife gave him some sugary drink thinking his blood sugar might be low.  Currently he feels back to baseline.  His initial EKG is unremarkable and looks like his baseline.  His initial blood sugar by EMS was 99 but that was after he had not drink.  He is not on any diabetes medications.   [MB]  2026 His symptoms improved, orthostatics negative.  He is ready to go and I think that is reasonable.   [MB]    Clinical Course User Index [MB] Terrilee FilesButler, Kano Heckmann C, MD     Final Clinical Impressions(s) / ED Diagnoses   Final diagnoses:  Syncope and collapse    ED Discharge Orders    None       Terrilee FilesButler, Munachimso Rigdon C, MD 07/07/18 [phone]

## 2018-07-06 NOTE — ED Notes (Signed)
Patient left at this time with all belongings. 

## 2018-07-06 NOTE — Discharge Instructions (Addendum)
Your evaluated in the emergency department for a syncopal event.  You had blood work EKG that did not show an obvious cause of your symptoms.  You are feeling improved after some IV fluids.  Will be important for you to follow-up with a primary care doctor or coming in the number for the [place] and wellness clinic.

## 2018-07-06 NOTE — ED Triage Notes (Signed)
Per [facility]:  Patient presents from ED for assessment after [facility].  Patient hx of DM, family assumed this was the cause and gave him a "sugary drink" prior to EMS arrival.  CBG 99 en route.  AxOx4

## 2019-03-22 ENCOUNTER — Emergency Department (HOSPITAL_COMMUNITY)
Admission: EM | Admit: 2019-03-22 | Discharge: 2019-03-22 | Disposition: A | Discharge status: Home / Self Care | Payer: Medicare Other | Attending: Emergency Medicine | Admitting: Emergency Medicine

## 2019-03-22 ENCOUNTER — Encounter (HOSPITAL_COMMUNITY): Payer: Self-pay | Admitting: Emergency Medicine

## 2019-03-22 ENCOUNTER — Other Ambulatory Visit: Payer: Self-pay

## 2019-03-22 DIAGNOSIS — E119 Type 2 diabetes mellitus without complications: Secondary | ICD-10-CM | POA: Insufficient documentation

## 2019-03-22 DIAGNOSIS — Z794 Long term (current) use of insulin: Secondary | ICD-10-CM | POA: Insufficient documentation

## 2019-03-22 DIAGNOSIS — F17210 Nicotine dependence, cigarettes, uncomplicated: Secondary | ICD-10-CM | POA: Insufficient documentation

## 2019-03-22 DIAGNOSIS — L02415 Cutaneous abscess of right lower limb: Secondary | ICD-10-CM | POA: Insufficient documentation

## 2019-03-22 DIAGNOSIS — L0291 Cutaneous abscess, unspecified: Secondary | ICD-10-CM

## 2019-03-22 LAB — CBG MONITORING, ED: Glucose-Capillary: 99 mg/dL (ref 70–99)

## 2019-03-22 MED ORDER — DOXYCYCLINE HYCLATE 100 MG PO CAPS: 100.0000 mg | ORAL_CAPSULE | Freq: Two times a day (BID) | ORAL | 0 refills | Status: DC

## 2019-03-22 MED ORDER — LIDOCAINE HCL 2 % IJ SOLN
20.0000 mL | Freq: Once | INTRAMUSCULAR | Status: AC
  Administered 2019-03-22: 13:00:00 400 mg
  Filled 2019-03-22: qty 20

## 2019-03-22 NOTE — ED Notes (Signed)
Patient verbalizes understanding of discharge instructions. Opportunity for questioning and answers were provided. Armband removed by staff, pt discharged from ED. Ambulated out to lobby  

## 2019-03-22 NOTE — Discharge Instructions (Addendum)
Please read attached information. If you experience any new or worsening signs or symptoms please return to the emergency room for evaluation. Please follow-up with your primary care provider or specialist as discussed. Please use medication prescribed only as directed and discontinue taking if you have any concerning signs or symptoms.   °

## 2019-03-22 NOTE — ED Notes (Addendum)
CBG- 99 

## 2019-03-22 NOTE — ED Provider Notes (Signed)
[facility]ERGENCY [facility] Provider Note   CSN: [id number] Arrival date & time: 03/22/19  1107    History   Chief Complaint Chief Complaint  Patient presents with  . Insect Bite    HPI William Walls is a 43 y.o. male.     HPI   43 year old male presents today with complaints of abscess.  Patient notes a history of pain redness and swelling to the right lateral thigh.  He believes this is a spider bite but is uncertain as he did not see the spider.  He denies any fever, he notes pain to that area.  He notes he is newly diagnosed diabetic and using insulin.  He did not check his blood sugar today.   Past Medical History:  Diagnosis Date  . Diabetes mellitus without complication (HCC)   . Seizures (HCC)     There are no active problems to display for this patient.   History reviewed. No pertinent surgical history.      Home Medications    Prior to Admission medications   Medication Sig Start Date End Date Taking? Authorizing Provider  doxycycline (VIBRAMYCIN) 100 MG capsule Take 1 capsule (100 mg total) by mouth 2 (two) times daily. 03/22/19   Eyvonne MechanicHedges, Sevana Grandinetti, PA-C    Family History History reviewed. No pertinent family history.  Social History Social History   Tobacco Use  . Smoking status: Current Every Day Smoker    Packs/day: 1.00    Types: Cigarettes  . Smokeless tobacco: Never Used  Substance Use Topics  . Alcohol use: No  . Drug use: No     Allergies   Patient has no known allergies.   Review of Systems Review of Systems  All other systems reviewed and are negative.    Physical Exam Updated Vital Signs BP 124/74 (BP Location: Right Arm)   Pulse 83   Temp 98.3 F (36.8 C) (Oral)   Resp 16   SpO2 100%   Physical Exam Vitals signs and nursing note reviewed.  Constitutional:      Appearance: He is well-developed.  HENT:     Head: Normocephalic and atraumatic.  Eyes:     General: No scleral icterus.     Right eye: No discharge.        Left eye: No discharge.     Conjunctiva/sclera: Conjunctivae normal.     Pupils: Pupils are equal, round, and reactive to light.  Neck:     Musculoskeletal: Normal range of motion.     Vascular: No JVD.     Trachea: No tracheal deviation.  Pulmonary:     Effort: Pulmonary effort is normal.     Breath sounds: No stridor.  Musculoskeletal:     Comments: 3 cm area of induration to right lateral thigh, erythema noted, no central fluctuance, tenderness palpation- central eschar noted  Neurological:     Mental Status: He is alert and oriented to person, place, and time.     Coordination: Coordination normal.  Psychiatric:        Behavior: Behavior normal.        Thought Content: Thought content normal.        Judgment: Judgment normal.      ED Treatments / Results  Labs (all labs ordered are listed, but only abnormal results are displayed) Labs Reviewed  CBG MONITORING, ED    EKG None  Radiology No results found.  Procedures .Marland Kitchen.Incision and Drainage Date/Time: 03/22/2019 1:21 PM Performed by: Eyvonne MechanicHedges, Dashanique Brownstein, PA-C  Authorized by: Eyvonne Mechanic, PA-C   Consent:    Consent obtained:  Verbal   Consent given by:  Patient   Risks discussed:  Bleeding, incomplete drainage, pain and damage to other organs   Alternatives discussed:  No treatment Universal protocol:    Procedure explained and questions answered to patient or proxy's satisfaction: yes     Relevant documents present and verified: yes     Patient identity confirmed:  Verbally with patient Location:    Type:  Abscess Pre-procedure details:    Skin preparation:  Betadine Anesthesia (see MAR for exact dosages):    Anesthesia method:  Local infiltration   Local anesthetic:  Lidocaine 2% w/o epi Procedure type:    Complexity:  Simple Procedure details:    Incision types:  Single straight   Incision depth:  Subcutaneous   Scalpel blade:  11   Wound management:  Probed and  deloculated   Drainage:  Purulent   Drainage amount:  Moderate Post-procedure details:    Patient tolerance of procedure:  Tolerated well, no immediate complications   (including critical care time)  EMERGENCY DEPARTMENT US SOFT TISSUE INTERPRETATION "Study: Limited Soft Tissue Ultrasound"  INDICATIONS: Soft tissue infection Multiple views of the body part were obtained in real-time with a multi-frequency linear probe  PERFORMED BY: Myself IMAGES ARCHIVED?: Yes SIDE:Right  BODY PART:Lower extremity INTERPRETATION:  Abcess present and Cellulitis present   Medications Ordered in ED Medications  lidocaine (XYLOCAINE) 2 % (with pres) injection 400 mg (has no administration in time range)     Initial Impression / Assessment and Plan / ED Course  I have reviewed the triage vital signs and the nursing notes.  Pertinent labs & imaging results that were available during my care of the patient were reviewed by me and considered in my medical decision making (see chart for details).        43 year old male presents today with abscess to right thigh.  This was I&D with success.  Patient placed on antibiotics.  He is no signs of systemic illness.  Discharged with outpatient follow-up and return precautions.  He verbalized understanding and agreement to today's plan.  Final Clinical Impressions(s) / ED Diagnoses   Final diagnoses:  Abscess    ED Discharge Orders         Ordered    doxycycline (VIBRAMYCIN) 100 MG capsule  2 times daily     03/22/19 1323           Eyvonne Mechanic, PA-C 03/22/19 1323    Benjiman Core, MD 03/22/19 1534

## 2019-03-22 NOTE — ED Triage Notes (Signed)
Pt has what he believes is a spider bite to his right thigh since Friday.

## 2020-01-22 ENCOUNTER — Emergency Department (HOSPITAL_COMMUNITY)
Admission: EM | Admit: 2020-01-22 | Discharge: 2020-01-22 | Discharge status: Against Medical Advice | Payer: Self-pay | Attending: Emergency Medicine | Admitting: Emergency Medicine

## 2020-01-22 ENCOUNTER — Encounter (HOSPITAL_COMMUNITY): Payer: Self-pay

## 2020-01-22 DIAGNOSIS — E119 Type 2 diabetes mellitus without complications: Secondary | ICD-10-CM | POA: Insufficient documentation

## 2020-01-22 DIAGNOSIS — W57XXXA Bitten or stung by nonvenomous insect and other nonvenomous arthropods, initial encounter: Secondary | ICD-10-CM | POA: Insufficient documentation

## 2020-01-22 DIAGNOSIS — Y929 Unspecified place or not applicable: Secondary | ICD-10-CM | POA: Insufficient documentation

## 2020-01-22 DIAGNOSIS — L03114 Cellulitis of left upper limb: Secondary | ICD-10-CM | POA: Insufficient documentation

## 2020-01-22 DIAGNOSIS — F17210 Nicotine dependence, cigarettes, uncomplicated: Secondary | ICD-10-CM | POA: Insufficient documentation

## 2020-01-22 DIAGNOSIS — Y999 Unspecified external cause status: Secondary | ICD-10-CM | POA: Insufficient documentation

## 2020-01-22 DIAGNOSIS — Y939 Activity, unspecified: Secondary | ICD-10-CM | POA: Insufficient documentation

## 2020-01-22 MED ORDER — TETANUS-DIPHTH-ACELL PERTUSSIS 5-2.5-18.5 LF-MCG/0.5 IM SUSP
0.5000 mL | Freq: Once | INTRAMUSCULAR | Status: DC
  Filled 2020-01-22: qty 0.5

## 2020-01-22 MED ORDER — CLINDAMYCIN HCL 300 MG PO CAPS
300.0000 mg | ORAL_CAPSULE | Freq: Once | ORAL | Status: DC
  Filled 2020-01-22: qty 1

## 2020-01-22 MED ORDER — OXYCODONE-ACETAMINOPHEN 5-325 MG PO TABS
1.0000 | ORAL_TABLET | Freq: Once | ORAL | Status: DC
  Filled 2020-01-22: qty 1

## 2020-01-22 NOTE — ED Triage Notes (Signed)
Pt presents with c/o possible spider bite to his right arm. Pt has a red and swollen area to the back of his right arm. Pt reports he believes the bite to be a spider bite. Pt reports the area has been present for 2 days.

## 2020-01-22 NOTE — Discharge Instructions (Signed)
You have chosen to leave AGAINST MEDICAL ADVICE. Should you change your mind, you are always welcome and encouraged to return to the ED. You are encouraged to follow-up with, at the very least, a primary care provider, or other similar medical professional on this matter.  

## 2020-01-22 NOTE — ED Notes (Addendum)
Patient refused medication and left AMA. PA made aware. Patient alert and oriented in NAD and ambulatory without difficulty.

## 2020-01-22 NOTE — ED Provider Notes (Signed)
[facility]-EMERGENCY DEPT Provider Note   CSN: [id number] Arrival date & time: 01/22/20  1114     History Chief Complaint  Patient presents with  . Insect Bite    William Walls is a 44 y.o. male.  HPI Patient is a 44 year old male with past medical history of diabetes and seizures presented to the ER with complaints of possible splinter to his left arm--he was noted in triage to be using his right arm however patient states he has no issues with his right arm. Patient states that it is achy, worse with movement and touch.  Feels swollen and hard.  He is concerned for an abscess.  Patient denies any fevers but does endorse some chills in the past 2 days.  He states that he first noticed the lesion 2 days ago in the shower.  States it seems like it is gotten somewhat worse.  He denies any nausea, vomiting, headaches, dizziness, fatigue, fever, weakness.  Patient states his blood sugar is usually within normal limits.  Is not taking any medications for his diabetes however.  Is uncertain when he last checked his blood sugar.  Patient is unforthcoming as a historian.     Past Medical History:  Diagnosis Date  . Diabetes mellitus without complication (HCC)   . Seizures (HCC)     There are no problems to display for this patient.   History reviewed. No pertinent surgical history.     History reviewed. No pertinent family history.  Social History   Tobacco Use  . Smoking status: Current Every Day Smoker    Packs/day: 1.00    Types: Cigarettes  . Smokeless tobacco: Never Used  Substance Use Topics  . Alcohol use: No  . Drug use: No    Home Medications Prior to Admission medications   Medication Sig Start Date End Date Taking? Authorizing Provider  doxycycline (VIBRAMYCIN) 100 MG capsule Take 1 capsule (100 mg total) by mouth 2 (two) times daily. 03/22/19   Eyvonne Mechanic, PA-C    Allergies    Patient has no known allergies.  Review of Systems    Review of Systems  Constitutional: Positive for chills. Negative for fatigue and fever.  HENT: Negative for congestion.   Respiratory: Negative for shortness of breath.   Cardiovascular: Negative for chest pain.  Gastrointestinal: Negative for abdominal pain.  Musculoskeletal: Negative for neck pain.       Left arm pain    Physical Exam Updated Vital Signs BP (!) 160/108 (BP Location: Left Arm)   Pulse 62   Temp 98.1 F (36.7 C) (Oral)   Resp 18   Ht 5\' 5"  (1.651 m)   Wt 94.7 kg   SpO2 99%   BMI 34.75 kg/m   Physical Exam Vitals and nursing note reviewed.  Constitutional:      General: He is not in acute distress.    Appearance: Normal appearance. He is not ill-appearing.  HENT:     Head: Normocephalic and atraumatic.     Mouth/Throat:     Mouth: Mucous membranes are moist.  Eyes:     General: No scleral icterus.       Right eye: No discharge.        Left eye: No discharge.     Conjunctiva/sclera: Conjunctivae normal.  Pulmonary:     Effort: Pulmonary effort is normal.     Breath sounds: No stridor.  Skin:    General: Skin is warm and dry.  Capillary Refill: Capillary refill takes less than 2 seconds.     Findings: Lesion present.     Comments: Patient has warm to touch indurated area to the left palmar forearm that is approximately 4 cm x 5 cm.  There is no streaking or lymphangitis.  There is no fluctuance appreciable on exam. No track marks.  Neurological:     Mental Status: He is alert and oriented to person, place, and time. Mental status is at baseline.     ED Results / Procedures / Treatments   Labs (all labs ordered are listed, but only abnormal results are displayed) Labs Reviewed - No data to display  EKG None  Radiology No results found.  Procedures Procedures (including critical care time)  Medications Ordered in ED Medications  oxyCODONE-acetaminophen (PERCOCET/ROXICET) 5-325 MG per tablet 1 tablet (1 tablet Oral Refused 01/22/20  1404)  clindamycin (CLEOCIN) capsule 300 mg (300 mg Oral Refused 01/22/20 1404)  Tdap (BOOSTRIX) injection 0.5 mL (0.5 mLs Intramuscular Refused 01/22/20 1404)    ED Course  I have reviewed the triage vital signs and the nursing notes.  Pertinent labs & imaging results that were available during my care of the patient were reviewed by me and considered in my medical decision making (see chart for details).    MDM Rules/Calculators/A&P                      Patient with left forearm cellulitis.  Plan to ultrasound and give clindamycin.  If there is drainable abscess will incise and drain.    Nurse called to inform the patient who left AMA.  He is refusing antibiotics and would prefer to be discharged at this time.  Patient wishes to leave AGAINST MEDICAL ADVICE. I personally explained need for further testing and my concerns for adverse outcomes if workup is incomplete. Specific concerns explained to patient include worsening symptoms, functional loss, long term sickness and death. Patient states understanding of risks and states they will return if they feel the need to at a later date to receive the recommended care or any other care at any time, regardless of their ability to pay for such care. Patient understands they are able to return at any time. Patient is able to explain back the risks of leaving AMA and still wishes to leave.   Specifically I recommend waiting to evaluate and exclude abscess and to receive antibiotics.  The patient is oriented to person, place, and time, has the capacity to make decisions regarding the medical care offered. The patient speaks coherently and exhibits no evidence of having an altered level of consciousness or alcohol or drug intoxication to a point that would impair judgment. They respond knowingly to questions about recommended treatment and alternate treatments including no further testing or treatment; participate in diagnostic and treatment decisions by  means of rational thought processes; and understand the items of minimum basic medical treatment information with respect to that treatment (the nature and seriousness of the illness, the nature of the treatment, the probable degree and duration of any benefits and risks of any medical intervention that is being recommended, and the consequences of lack of treatment, and the nature, risks, and benefits of any reasonable alternatives).  The patient understands the relevant information of the nature of their medical condition, as well as the risks, benefits, and treatment alternatives (including non-treatment), consequences of refusing care, and can competently communicate a rational explanation about their choice of care options.  Included in AVS was the following message:  You have chosen to leave [place]. Should you change your mind, you are always welcome and encouraged to return to the ED. You are encouraged to follow-up with, at the very least, a primary care provider, or other similar medical professional on this matter.    Final Clinical Impression(s) / ED Diagnoses Final diagnoses:  Bug bite with infection, initial encounter    Rx / DC Orders ED Discharge Orders    None       Tedd Sias, [place] 01/22/20 2150    Sherwood Gambler, MD 01/27/20 [phone]

## 2020-02-23 ENCOUNTER — Emergency Department (HOSPITAL_COMMUNITY): Payer: Medicare HMO

## 2020-02-23 ENCOUNTER — Inpatient Hospital Stay (HOSPITAL_COMMUNITY)
Admission: EM | Admit: 2020-02-23 | Discharge: 2020-02-25 | DRG: 603 | Disposition: A | Discharge status: Home / Self Care | Payer: Medicare HMO | Attending: Internal Medicine | Admitting: Internal Medicine

## 2020-02-23 ENCOUNTER — Other Ambulatory Visit: Payer: Self-pay

## 2020-02-23 ENCOUNTER — Encounter (HOSPITAL_COMMUNITY): Payer: Self-pay

## 2020-02-23 DIAGNOSIS — R229 Localized swelling, mass and lump, unspecified: Secondary | ICD-10-CM

## 2020-02-23 DIAGNOSIS — Z9114 Patient's other noncompliance with medication regimen: Secondary | ICD-10-CM

## 2020-02-23 DIAGNOSIS — S50862A Insect bite (nonvenomous) of left forearm, initial encounter: Secondary | ICD-10-CM | POA: Diagnosis present

## 2020-02-23 DIAGNOSIS — E119 Type 2 diabetes mellitus without complications: Secondary | ICD-10-CM | POA: Diagnosis present

## 2020-02-23 DIAGNOSIS — Z7151 Drug abuse counseling and surveillance of drug abuser: Secondary | ICD-10-CM | POA: Diagnosis not present

## 2020-02-23 DIAGNOSIS — Z79899 Other long term (current) drug therapy: Secondary | ICD-10-CM

## 2020-02-23 DIAGNOSIS — F17210 Nicotine dependence, cigarettes, uncomplicated: Secondary | ICD-10-CM | POA: Diagnosis present

## 2020-02-23 DIAGNOSIS — G40909 Epilepsy, unspecified, not intractable, without status epilepticus: Secondary | ICD-10-CM | POA: Diagnosis present

## 2020-02-23 DIAGNOSIS — Z7984 Long term (current) use of oral hypoglycemic drugs: Secondary | ICD-10-CM | POA: Diagnosis not present

## 2020-02-23 DIAGNOSIS — L03114 Cellulitis of left upper limb: Secondary | ICD-10-CM | POA: Diagnosis present

## 2020-02-23 DIAGNOSIS — X58XXXA Exposure to other specified factors, initial encounter: Secondary | ICD-10-CM | POA: Diagnosis present

## 2020-02-23 DIAGNOSIS — Z20822 Contact with and (suspected) exposure to covid-19: Secondary | ICD-10-CM | POA: Diagnosis present

## 2020-02-23 DIAGNOSIS — F1210 Cannabis abuse, uncomplicated: Secondary | ICD-10-CM | POA: Diagnosis present

## 2020-02-23 DIAGNOSIS — Z720 Tobacco use: Secondary | ICD-10-CM | POA: Diagnosis present

## 2020-02-23 DIAGNOSIS — Z716 Tobacco abuse counseling: Secondary | ICD-10-CM | POA: Diagnosis not present

## 2020-02-23 DIAGNOSIS — R569 Unspecified convulsions: Secondary | ICD-10-CM

## 2020-02-23 LAB — COMPREHENSIVE METABOLIC PANEL
ALT: 19 U/L (ref 0–44)
AST: 21 U/L (ref 15–41)
Albumin: 3.6 g/dL (ref 3.5–5.0)
Alkaline Phosphatase: 70 U/L (ref 38–126)
Anion gap: 10 (ref 5–15)
BUN: 17 mg/dL (ref 6–20)
CO2: 25 mmol/L (ref 22–32)
Calcium: 9.1 mg/dL (ref 8.9–10.3)
Chloride: 103 mmol/L (ref 98–111)
Creatinine, Ser: 1.03 mg/dL (ref 0.61–1.24)
GFR calc Af Amer: >60 mL/min (ref >60)
GFR calc non Af Amer: >60 mL/min (ref >60)
Glucose, Bld: 89 mg/dL (ref 70–99)
Potassium: 3.5 mmol/L (ref 3.5–5.1)
Sodium: 138 mmol/L (ref 135–145)
Total Bilirubin: 0.6 mg/dL (ref 0.3–1.2)
Total Protein: 7.6 g/dL (ref 6.5–8.1)

## 2020-02-23 LAB — GLUCOSE, CAPILLARY
Glucose-Capillary: 90 mg/dL (ref 70–99)
Glucose-Capillary: 97 mg/dL (ref 70–99)

## 2020-02-23 LAB — CBC WITH DIFFERENTIAL/PLATELET
Abs Immature Granulocytes: 0.02 10*3/uL (ref 0.00–0.07)
Basophils Absolute: 0 10*3/uL (ref 0.0–0.1)
Basophils Relative: 0 %
Eosinophils Absolute: 0.1 10*3/uL (ref 0.0–0.5)
Eosinophils Relative: 1 %
HCT: 43.4 % (ref 39.0–52.0)
Hemoglobin: 14.2 g/dL (ref 13.0–17.0)
Immature Granulocytes: 0 %
Lymphocytes Relative: 14 %
Lymphs Abs: 1.2 10*3/uL (ref 0.7–4.0)
MCH: 28.8 pg (ref 26.0–34.0)
MCHC: 32.7 g/dL (ref 30.0–36.0)
MCV: 88 fL (ref 80.0–100.0)
Monocytes Absolute: 0.8 10*3/uL (ref 0.1–1.0)
Monocytes Relative: 10 %
Neutro Abs: 6.3 10*3/uL (ref 1.7–7.7)
Neutrophils Relative %: 75 %
Platelets: 268 10*3/uL (ref 150–400)
RBC: 4.93 MIL/uL (ref 4.22–5.81)
RDW: 13.9 % (ref 11.5–15.5)
WBC: 8.4 10*3/uL (ref 4.0–10.5)
nRBC: 0 % (ref 0.0–0.2)

## 2020-02-23 LAB — LACTIC ACID, PLASMA: Lactic Acid, Venous: 1.4 mmol/L (ref 0.5–1.9)

## 2020-02-23 LAB — HEMOGLOBIN A1C
Hgb A1c MFr Bld: 5.7 % — ABNORMAL HIGH (ref 4.8–5.6)
Mean Plasma Glucose: 116.89 mg/dL

## 2020-02-23 LAB — HIV ANTIBODY (ROUTINE TESTING W REFLEX): HIV Screen 4th Generation wRfx: NONREACTIVE

## 2020-02-23 LAB — SARS CORONAVIRUS 2 (TAT 6-24 HRS): SARS Coronavirus 2: NEGATIVE

## 2020-02-23 MED ORDER — OXYCODONE HCL 5 MG PO TABS
5.0000 mg | ORAL_TABLET | ORAL | Status: DC | PRN
  Administered 2020-02-24: 5 mg via ORAL
  Filled 2020-02-23: qty 1

## 2020-02-23 MED ORDER — ACETAMINOPHEN 650 MG RE SUPP: 650.0000 mg | Freq: Four times a day (QID) | RECTAL | Status: DC | PRN

## 2020-02-23 MED ORDER — ACETAMINOPHEN 325 MG PO TABS: 650.0000 mg | ORAL_TABLET | Freq: Four times a day (QID) | ORAL | Status: DC | PRN

## 2020-02-23 MED ORDER — INSULIN ASPART 100 UNIT/ML ~~LOC~~ SOLN: 0.0000 [IU] | Freq: Every day | SUBCUTANEOUS | Status: DC

## 2020-02-23 MED ORDER — SODIUM CHLORIDE 0.9% FLUSH: 3.0000 mL | Freq: Once | INTRAVENOUS | Status: DC

## 2020-02-23 MED ORDER — OXYCODONE-ACETAMINOPHEN 5-325 MG PO TABS
1.0000 | ORAL_TABLET | Freq: Once | ORAL | Status: AC
  Administered 2020-02-23: 1 via ORAL
  Filled 2020-02-23: qty 1

## 2020-02-23 MED ORDER — VANCOMYCIN HCL 1500 MG/300ML IV SOLN
1500.0000 mg | Freq: Once | INTRAVENOUS | Status: AC
  Administered 2020-02-23: 1500 mg via INTRAVENOUS
  Filled 2020-02-23: qty 300

## 2020-02-23 MED ORDER — ENOXAPARIN SODIUM 40 MG/0.4ML ~~LOC~~ SOLN
40.0000 mg | SUBCUTANEOUS | Status: DC
  Administered 2020-02-23 – 2020-02-24 (×2): 40 mg via SUBCUTANEOUS
  Filled 2020-02-23 (×2): qty 0.4

## 2020-02-23 MED ORDER — ONDANSETRON HCL 4 MG PO TABS: 4.0000 mg | ORAL_TABLET | Freq: Four times a day (QID) | ORAL | Status: DC | PRN

## 2020-02-23 MED ORDER — VANCOMYCIN HCL 750 MG/150ML IV SOLN
750.0000 mg | Freq: Two times a day (BID) | INTRAVENOUS | Status: DC
  Administered 2020-02-24 – 2020-02-25 (×3): 750 mg via INTRAVENOUS
  Filled 2020-02-23 (×4): qty 150

## 2020-02-23 MED ORDER — HYDROMORPHONE HCL 1 MG/ML IJ SOLN: 0.5000 mg | INTRAMUSCULAR | Status: DC | PRN

## 2020-02-23 MED ORDER — ONDANSETRON HCL 4 MG/2ML IJ SOLN: 4.0000 mg | Freq: Four times a day (QID) | INTRAMUSCULAR | Status: DC | PRN

## 2020-02-23 MED ORDER — INSULIN ASPART 100 UNIT/ML ~~LOC~~ SOLN: 0.0000 [IU] | Freq: Three times a day (TID) | SUBCUTANEOUS | Status: DC

## 2020-02-23 NOTE — ED Triage Notes (Signed)
Patient complains of pain and swelling to left arm. Reports that he thinks related to spider bite. Wound with drainage and redness noted

## 2020-02-23 NOTE — H&P (Signed)
History and Physical    Cowan Pilar KTG:[id number] DOB: 1976-09-01 DOA: 02/23/2020  PCP: Patient, No Pcp Per  Patient coming from: Home  I have personally briefly reviewed patient's old medical records in [facility]  Chief Complaint: Left arm pain and swelling  HPI: Dehaven Sine is a 44 y.o. male with medical history significant of diabetes mellitus, seizure disorder, tobacco abuse, marijuana abuse presents to emergency department due to worsening left arm pain and swelling since 2 days.    Patient tells me that he had a spider bite 2 days ago and since then he has worsening pain, redness, warmth, swelling of left arm associated with yellowish drainage.  No history of fever, chills, generalized weakness, lethargic, IV drug abuse, decreased appetite or weight loss.  Of note: Patient presented with similar symptoms on 01/22/2020 however he left AMA and refused to take antibiotics at that time.  Has history of type 2 diabetes mellitus-however quit taking Metformin since 3 months due to financial issues.  Has history of seizure disorder and was on Dilantin 150 mg p.o. 3 times daily however stopped taking it since last 4 years due to Parkinson-like side effects.  He tells me that he had no seizure even though he is noncompliant with his AED.  He does not have PCP.  No history of headache, blurry vision, chest pain, shortness of breath, palpitation, leg swelling, urinary or bowel changes.  He smokes 1 pack of cigarettes per day, uses marijuana (last intake yesterday) however denies alcohol abuse.  ED Course: Upon arrival to ED: Patient's vital signs stable, initial labs such as CBC, CMP, lactic acid: WNL.  COVID-19 pending.  Ultrasound of left arm shows ill-defined soft tissue edema/thickening about the elbow and at the level of medial forearm.  No focal fluid collection or abscess-like collection is identified.  Patient received IV fluids and vancomycin in the ED.  [facility] hospitalist  consulted for admission for cellulitis of left arm. Review of Systems: As per HPI otherwise negative.    Past Medical History:  Diagnosis Date  . Diabetes mellitus without complication (HCC)   . Seizures (HCC)     History reviewed. No pertinent surgical history.   reports that he has been smoking cigarettes. He has been smoking about 1.00 pack per day. He has never used smokeless tobacco. He reports that he does not drink alcohol or use drugs.  No Known Allergies  No family history on file.  Prior to Admission medications   Medication Sig Start Date End Date Taking? Authorizing Provider  doxycycline (VIBRAMYCIN) 100 MG capsule Take 1 capsule (100 mg total) by mouth 2 (two) times daily. 03/22/19   Eyvonne Mechanic, PA-C    Physical Exam: Vitals:   02/23/20 1026  BP: (!) 129/57  Pulse: 79  Resp: 18  Temp: 98.1 F (36.7 C)  SpO2: 100%    Constitutional: NAD, calm, comfortable, on room air, communicating well Eyes: PERRL, lids and conjunctivae normal ENMT: Mucous membranes are moist. Posterior pharynx clear of any exudate or lesions.Normal dentition.  Neck: normal, supple, no masses, no thyromegaly Respiratory: clear to auscultation bilaterally, no wheezing, no crackles. Normal respiratory effort. No accessory muscle use.  Cardiovascular: Regular rate and rhythm, no murmurs / rubs / gallops. No extremity edema. 2+ pedal pulses. No carotid bruits.  Abdomen: no tenderness, no masses palpated. No hepatosplenomegaly. Bowel sounds positive.  Musculoskeletal:      Neurologic: CN 2-12 grossly intact. Sensation intact, DTR normal. Strength 5/5 in all 4.  Psychiatric: Normal judgment and insight. Alert and oriented x 3. Normal mood.    Labs on Admission: I have personally reviewed following labs and imaging studies  CBC: Recent Labs  Lab 02/23/20 1059  WBC 8.4  NEUTROABS 6.3  HGB 14.2  HCT 43.4  MCV 88.0  PLT 268   Basic Metabolic Panel: Recent Labs  Lab  02/23/20 1059  NA 138  K 3.5  CL 103  CO2 25  GLUCOSE 89  BUN 17  CREATININE 1.03  CALCIUM 9.1   GFR: CrCl cannot be calculated (Unknown ideal weight.). Liver Function Tests: Recent Labs  Lab 02/23/20 1059  AST 21  ALT 19  ALKPHOS 70  BILITOT 0.6  PROT 7.6  ALBUMIN 3.6   No results for input(s): LIPASE, AMYLASE in the last 168 hours. No results for input(s): AMMONIA in the last 168 hours. Coagulation Profile: No results for input(s): INR, PROTIME in the last 168 hours. Cardiac Enzymes: No results for input(s): CKTOTAL, CKMB, CKMBINDEX, TROPONINI in the last 168 hours. BNP (last 3 results) No results for input(s): PROBNP in the last 8760 hours. HbA1C: No results for input(s): HGBA1C in the last 72 hours. CBG: No results for input(s): GLUCAP in the last 168 hours. Lipid Profile: No results for input(s): CHOL, HDL, LDLCALC, TRIG, CHOLHDL, LDLDIRECT in the last 72 hours. Thyroid Function Tests: No results for input(s): TSH, T4TOTAL, FREET4, T3FREE, THYROIDAB in the last 72 hours. Anemia Panel: No results for input(s): VITAMINB12, FOLATE, FERRITIN, TIBC, IRON, RETICCTPCT in the last 72 hours. Urine analysis:    Component Value Date/Time   COLORURINE YELLOW 07/06/2018 1739   APPEARANCEUR CLEAR 07/06/2018 1739   LABSPEC 1.032 (H) 07/06/2018 1739   PHURINE 5.0 07/06/2018 1739   GLUCOSEU NEGATIVE 07/06/2018 1739   HGBUR NEGATIVE 07/06/2018 1739   BILIRUBINUR NEGATIVE 07/06/2018 1739   KETONESUR NEGATIVE 07/06/2018 1739   PROTEINUR NEGATIVE 07/06/2018 1739   NITRITE NEGATIVE 07/06/2018 1739   LEUKOCYTESUR NEGATIVE 07/06/2018 1739    Radiological Exams on Admission: [place] LT UPPER EXTREM LTD SOFT TISSUE NON VASCULAR  Result Date: 02/23/2020 CLINICAL DATA:  Spider bite 1 week ago.  Soft tissue infection. EXAM: ULTRASOUND LEFT UPPER EXTREMITY LIMITED TECHNIQUE: Ultrasound examination of the upper extremity soft tissues was performed in the area of clinical concern.  COMPARISON:  None. FINDINGS: Soft tissues about the elbow and medial forearm are evaluated with ultrasound. There is diffuse soft tissue edema, ill-defined. No focal fluid collection or abscess-like collection is demonstrated. IMPRESSION: Ill-defined soft tissue edema/thickening about the elbow and at the level of the medial forearm, but no focal fluid collection or abscess-like collection is identified. Electronically Signed   By: Bary Richard M.D.   On: 02/23/2020 13:29     Assessment/Plan Principal Problem:   Cellulitis of left arm Active Problems:   Tobacco abuse   Marijuana abuse   Seizures (HCC)   Diabetes mellitus without complication (HCC)   Cellulitis of left arm: -Patient presented with worsening left arm swelling, pain, warmth and yellowish discharge. -Patient is afebrile with no leukocytosis.  Vital signs stable in ED.  Lactic acid: WNL -Reviewed ultrasound of left arm -Admit patient on the floor. -Start on IV Vanco and Rocephin.   -Tylenol/oxycodone/Dilaudid as needed for mild/moderate/severe pain respectively -Ordered blood culture and wound culture. -Consulted wound care  Type II diabetes mellitus: -Noncompliant with Metformin.  Check A1c.  Blood glucose: WNL.  Started on sliding scale insulin.  Monitor blood sugar closely.  Seizure disorder: -Noncompliant with  Dilantin. -Needs to follow-up with neurology outpatient.  Tobacco abuse: Counseled about cessation  Marijuana abuse: Counseled about cessation  DVT prophylaxis: Lovenox/SCD/TED Code Status: Full code Family Communication: None present at bedside.  Plan of care discussed with patient in length and he verbalized understanding and agreed with it. Disposition Plan: Likely home in 1 to 2 days Consults called: None Admission status: Inpatient   Mckinley Jewel MD [facility] Pager [phone]  If 7PM-7AM, please contact night-coverage [email or web address] Password [facility]  02/23/2020, 2:08 PM

## 2020-02-23 NOTE — ED Provider Notes (Signed)
[place] EMERGENCY DEPARTMENT Provider Note   CSN: [id number] Arrival date & time: 02/23/20  1020     History Chief Complaint  Patient presents with  . Wound Infection    William Walls is a 44 y.o. male.  Patient with history of diabetes, not currently on any medications presents to emergency department today with complaint of spider bites to his left arm and right lower back.  Patient states that the symptoms started 2 days ago.  Of note, patient had an emergency department visit on 01/22/2020 for left arm infection at which time he left AGAINST MEDICAL ADVICE.  Patient denies any fevers chills, nausea or vomiting.  He states that he applied some sort of wax to the wound.  He has had pus draining from an opening or his elbow.  He has redness and warmth from the wrist to the upper arm.  No chest pain, cough, shortness of breath, abdominal pain.         Past Medical History:  Diagnosis Date  . Diabetes mellitus without complication ([place])   . Seizures ([place])     There are no problems to display for this patient.   History reviewed. No pertinent surgical history.     No family history on file.  Social History   Tobacco Use  . Smoking status: Current Every Day Smoker    Packs/day: 1.00    Types: Cigarettes  . Smokeless tobacco: Never Used  Substance Use Topics  . Alcohol use: No  . Drug use: No    Home Medications Prior to Admission medications   Medication Sig Start Date End Date Taking? Authorizing Provider  doxycycline (VIBRAMYCIN) 100 MG capsule Take 1 capsule (100 mg total) by mouth 2 (two) times daily. 03/22/19   Okey Regal, PA-C    Allergies    Patient has no known allergies.  Review of Systems   Review of Systems  Constitutional: Negative for fever.  HENT: Negative for rhinorrhea and sore throat.   Eyes: Negative for redness.  Respiratory: Negative for cough.   Cardiovascular: Negative for chest pain.  Gastrointestinal:  Negative for abdominal pain, diarrhea, nausea and vomiting.  Genitourinary: Negative for dysuria.  Musculoskeletal: Positive for myalgias.  Skin: Positive for color change. Negative for rash.       Positive for abscess  Neurological: Negative for headaches.  Hematological: Negative for adenopathy.    Physical Exam Updated Vital Signs BP (!) 129/57   Pulse 79   Temp 98.1 F (36.7 C)   Resp 18   SpO2 100%   Physical Exam Vitals and nursing note reviewed.  Constitutional:      Appearance: He is well-developed.  HENT:     Head: Normocephalic and atraumatic.  Eyes:     General:        Right eye: No discharge.        Left eye: No discharge.     Conjunctiva/sclera: Conjunctivae normal.  Cardiovascular:     Rate and Rhythm: Normal rate and regular rhythm.     Heart sounds: Normal heart sounds.  Pulmonary:     Effort: Pulmonary effort is normal.     Breath sounds: Normal breath sounds.  Abdominal:     Palpations: Abdomen is soft.     Tenderness: There is no abdominal tenderness.  Musculoskeletal:     Cervical back: Normal range of motion and neck supple.  Skin:    General: Skin is warm and dry.     Comments: Patient  with induration to the proximal forearm with 2 ulcerated areas.  Mild purulent drainage.  No focal areas of fluctuance.  Patient with erythema and cellulitis extending from the wrist up to the proximal upper arm over the triceps area.  Neurological:     Mental Status: He is alert.              ED Results / Procedures / Treatments   Labs (all labs ordered are listed, but only abnormal results are displayed) Labs Reviewed  SARS CORONAVIRUS 2 (TAT 6-24 HRS)  CULTURE, BLOOD (ROUTINE X 2)  CULTURE, BLOOD (ROUTINE X 2)  LACTIC ACID, PLASMA  COMPREHENSIVE METABOLIC PANEL  CBC WITH DIFFERENTIAL/PLATELET  HEMOGLOBIN A1C    EKG None  Radiology [place] LT UPPER EXTREM LTD SOFT TISSUE NON VASCULAR  Result Date: 02/23/2020 CLINICAL DATA:  Spider bite 1 week  ago.  Soft tissue infection. EXAM: ULTRASOUND LEFT UPPER EXTREMITY LIMITED TECHNIQUE: Ultrasound examination of the upper extremity soft tissues was performed in the area of clinical concern. COMPARISON:  None. FINDINGS: Soft tissues about the elbow and medial forearm are evaluated with ultrasound. There is diffuse soft tissue edema, ill-defined. No focal fluid collection or abscess-like collection is demonstrated. IMPRESSION: Ill-defined soft tissue edema/thickening about the elbow and at the level of the medial forearm, but no focal fluid collection or abscess-like collection is identified. Electronically Signed   By: Bary Richard M.D.   On: 02/23/2020 13:29    Procedures Procedures (including critical care time)  Medications Ordered in ED Medications  sodium chloride flush (NS) 0.9 % injection 3 mL (has no administration in time range)  vancomycin (VANCOREADY) IVPB 1500 mg/300 mL (has no administration in time range)  vancomycin (VANCOREADY) IVPB 750 mg/150 mL (has no administration in time range)  oxyCODONE-acetaminophen (PERCOCET/ROXICET) 5-325 MG per tablet 1 tablet (1 tablet Oral Given 02/23/20 1222)    ED Course  I have reviewed the triage vital signs and the nursing notes.  Pertinent labs & imaging results that were available during my care of the patient were reviewed by me and considered in my medical decision making (see chart for details).  Patient seen and examined.  Lab work ordered.  Soft tissue ultrasound ordered to evaluate for any fluid collection.  Vital signs reviewed and are as follows: BP (!) 129/57   Pulse 79   Temp 98.1 F (36.7 C)   Resp 18   SpO2 100%   Ultrasound demonstrates widespread cellulitis but no definitive abscess.  Patient's labs are reassuring but given extensive nature of cellulitis and history of diabetes, feel that patient will be best served with admission for IV antibiotics.  Discussed with Dr. Judd Lien. Discussed with patient who agrees.   1:56  PM Spoke with Dr. Jacqulyn Bath of [facility] who will see patient.     MDM Rules/Calculators/A&P                      Patient with reported history of diabetes although blood sugars currently controlled despite being off of treatment.  He has significant cellulitis extending from the wrist to the upper arm.  Will admit for IV antibiotic treatment and to monitor for worsening.  At this point, no targets for incision and drainage.    Final Clinical Impression(s) / ED Diagnoses Final diagnoses:  Soft tissue swelling  Cellulitis of left arm    Rx / DC Orders ED Discharge Orders    None       Renne Crigler, PA-C  02/23/20 1358    Geoffery Lyons, MD 02/24/20 [phone]

## 2020-02-23 NOTE — Progress Notes (Signed)
Pharmacy Antibiotic Note  William Walls is a 44 y.o. male admitted on 02/23/2020 with cellulitis, possible spider bite now with drainage/redness.  Pharmacy has been consulted for vancomycin dosing.  WBC wnl, AF, LA 1.4  Plan: Vancomycin 1500 mg IV x 1, then 750mg  IV every 12 hours Goal AUC 400-550. Expected AUC: 435 SCr used: 1.03 Monitor renal function, LOT and ability to narrow      Temp (24hrs), Avg:98.1 F (36.7 C), Min:98.1 F (36.7 C), Max:98.1 F (36.7 C)  Recent Labs  Lab 02/23/20 1059  WBC 8.4  CREATININE 1.03  LATICACIDVEN 1.4    CrCl cannot be calculated (Unknown ideal weight.).    No Known Allergies  02/25/20, PharmD Clinical Pharmacist ED Pharmacist Phone # [phone] 02/23/2020 1:44 PM

## 2020-02-24 LAB — BASIC METABOLIC PANEL
Anion gap: 10 (ref 5–15)
BUN: 14 mg/dL (ref 6–20)
CO2: 22 mmol/L (ref 22–32)
Calcium: 8.4 mg/dL — ABNORMAL LOW (ref 8.9–10.3)
Chloride: 102 mmol/L (ref 98–111)
Creatinine, Ser: 0.94 mg/dL (ref 0.61–1.24)
GFR calc Af Amer: >60 mL/min (ref >60)
GFR calc non Af Amer: >60 mL/min (ref >60)
Glucose, Bld: 95 mg/dL (ref 70–99)
Potassium: 3.6 mmol/L (ref 3.5–5.1)
Sodium: 134 mmol/L — ABNORMAL LOW (ref 135–145)

## 2020-02-24 LAB — GLUCOSE, CAPILLARY
Glucose-Capillary: 90 mg/dL (ref 70–99)
Glucose-Capillary: 95 mg/dL (ref 70–99)
Glucose-Capillary: 89 mg/dL (ref 70–99)
Glucose-Capillary: 73 mg/dL (ref 70–99)

## 2020-02-24 LAB — CBC
HCT: 38.1 % — ABNORMAL LOW (ref 39.0–52.0)
Hemoglobin: 12.7 g/dL — ABNORMAL LOW (ref 13.0–17.0)
MCH: 28.7 pg (ref 26.0–34.0)
MCHC: 33.3 g/dL (ref 30.0–36.0)
MCV: 86.2 fL (ref 80.0–100.0)
Platelets: 255 10*3/uL (ref 150–400)
RBC: 4.42 MIL/uL (ref 4.22–5.81)
RDW: 13.5 % (ref 11.5–15.5)
WBC: 5 10*3/uL (ref 4.0–10.5)
nRBC: 0 % (ref 0.0–0.2)

## 2020-02-24 NOTE — Plan of Care (Signed)

## 2020-02-24 NOTE — Consult Note (Signed)
WOC Nurse Consult Note: Reason for Consult: full thickness open wounds to left forearm. Wound type:infectious Pressure Injury POA: N/A Measurement:Two open areas in a 7cm x 4.5cm area with the proximal wound being larger. No periwound fluctuance, surrounding tissue is erythematous, indurated and warm   Wound bed: Proximal wound is red, moist and distal wound presents with yellow nonviable tissue (slough) Drainage (amount, consistency, odor) Small amount serosanguinous Periwound:As noted above. Dressing procedure/placement/frequency:Systemic antibiotics have been ordered.  I have provided Nursing with guidance for topical care using xeroform gauze (antimicrobial, nonadherent, astringent) following soap and water cleanse, saline rinse and pat dry. Securement will be with Kerlix roll gauze/paper tape. Elevation on pillow is suggested.  WOC nursing team will not follow, but will remain available to this patient, the nursing and medical teams.  Please re-consult if needed. Thanks, Ladona Mow, MSN, RN, GNP, [place]  Pager# [phone]

## 2020-02-24 NOTE — Progress Notes (Addendum)
PROGRESS NOTE    William Walls  QQV:[id number] DOB: Aug 15, 1976 DOA: 02/23/2020 PCP: Patient, No Pcp Per    Brief Narrative:  William Walls is a 44 y.o. male with medical history significant of diabetes mellitus, seizure disorder, tobacco abuse, marijuana abuse presents to emergency department due to worsening left arm pain and swelling since 2 days.    Patient tells me that he had a spider bite 2 days ago and since then he has worsening pain, redness, warmth, swelling of left arm associated with yellowish drainage.  No history of fever, chills, generalized weakness, lethargic, IV drug abuse, decreased appetite or weight loss.  Of note: Patient presented with similar symptoms on 01/22/2020 however he left AMA and refused to take antibiotics at that time.  Has history of type 2 diabetes mellitus-however quit taking Metformin since 3 months due to financial issues.  Has history of seizure disorder and was on Dilantin 150 mg p.o. 3 times daily however stopped taking it since last 4 years due to Parkinson-like side effects.  He tells me that he had no seizure even though he is noncompliant with his AED.  He does not have PCP.  No history of headache, blurry vision, chest pain, shortness of breath, palpitation, leg swelling, urinary or bowel changes.  He smokes 1 pack of cigarettes per day, uses marijuana (last intake yesterday) however denies alcohol abuse.  Upon arrival to ED: Patient's vital signs stable, initial labs such as CBC, CMP, lactic acid: WNL. COVID-19 pending.  Ultrasound of left arm shows ill-defined soft tissue edema/thickening about the elbow and at the level of medial forearm.  No focal fluid collection or abscess-like collection is identified. Patient received IV fluids and vancomycin in the ED.   Assessment & Plan:   Principal Problem:   Cellulitis of left arm Active Problems:   Tobacco abuse   Marijuana abuse   Seizures (HCC)   Diabetes mellitus without complication  (HCC)   Left arm cellulitis Patient presenting to the ED with continued left arm swelling, pain, or and redness.  Reports possible spider bite roughly 3 weeks ago.  Has not been receiving any antibiotics.  Ultrasound left arm with ill-defined soft tissue edema, thickening at the elbow and medial forearm.  No focal fluid collection or abscess noted.  Patient is afebrile without leukocytosis. --Wound care consulted, appreciate assistance --Blood cultures x2: No growth less than 12 hours --Continue empiric antibiotics with vancomycin and ceftriaxone --Pain control with oxycodone, Dilaudid, Tylenol prn  Type 2 diabetes mellitus Previously was on Metformin, has been noncompliant due to poor follow-up.  Hemoglobin A1c 5.7, well controlled. --Continue insulin sliding scale for coverage while inpatient --Likely just resume diet control on discharge given hemoglobin A1c only 5.7  History of seizure disorder Was previously on Dilantin, has been noncompliant for several years.  Outpatient follow-up with neurology.  Polysubstance abuse with tobacco and THC Counseled on need for cessation.   DVT prophylaxis: Lovenox Code Status: Full code Family Communication: None present at bedside  Disposition Plan:  Status is: Inpatient  Remains inpatient appropriate because:Ongoing active pain requiring inpatient pain management, Unsafe d/c plan, IV treatments appropriate due to intensity of illness or inability to take PO and Inpatient level of care appropriate due to severity of illness   Dispo: The patient is from: Home with wife, states lives in a motel              Anticipated d/c is to: Home  Anticipated d/c date is: 1 day              Patient currently is not medically stable to d/c.   Consultants:   none  Procedures:   None  Antimicrobials:   Vancomycin 4/17>>  Ceftriaxone 4/17>>   Subjective: Patient seen and examined bedside, resting comfortably.  Patient with no  significant pain in his right upper extremity, with normal active and passive range of motion.  His biggest concern today is about his upcoming court date and being able to be discharged by tomorrow.  No other complaints or concerns at this time.  Objective: Vitals:   02/23/20 1415 02/23/20 1618 02/23/20 2127 02/24/20 0330  BP: 124/90 (!) 110/91 110/70 125/74  Pulse: (!) 55 (!) 52 (!) 44 (!) 45  Resp: 18 16 16    Temp:  98.3 F (36.8 C) 98 F (36.7 C) 98 F (36.7 C)  TempSrc:  Oral Oral Oral  SpO2: 100% 100% 96% 97%  Weight:  92.4 kg    Height:  5\' 6"  (1.676 m)      Intake/Output Summary (Last 24 hours) at 02/24/2020 1310 Last data filed at 02/24/2020 0810 Gross per 24 hour  Intake 660 ml  Output --  Net 660 ml   Filed Weights   02/23/20 1618  Weight: 92.4 kg    Examination:  General exam: Appears calm and comfortable  Respiratory system: Clear to auscultation. Respiratory effort normal. Cardiovascular system: S1 & S2 heard, RRR. No JVD, murmurs, rubs, gallops or clicks. No pedal edema. Gastrointestinal system: Abdomen is nondistended, soft and nontender. No organomegaly or masses felt. Normal bowel sounds heard. Central nervous system: Alert and oriented. No focal neurological deficits. Extremities: Symmetric 5 x 5 power. Skin: Mild erythema with edema and wounds as depicted below left upper extremity Psychiatry: Judgement and insight appear normal. Mood & affect appropriate.         Data Reviewed: I have personally reviewed following labs and imaging studies  CBC: Recent Labs  Lab 02/23/20 1059 02/24/20 0245  WBC 8.4 5.0  NEUTROABS 6.3  --   HGB 14.2 12.7*  HCT 43.4 38.1*  MCV 88.0 86.2  PLT 268 255   Basic Metabolic Panel: Recent Labs  Lab 02/23/20 1059 02/24/20 0245  NA 138 134*  K 3.5 3.6  CL 103 102  CO2 25 22  GLUCOSE 89 95  BUN 17 14  CREATININE 1.03 0.94  CALCIUM 9.1 8.4*   GFR: Estimated Creatinine Clearance: 110 mL/min (by C-G  formula based on SCr of 0.94 mg/dL). Liver Function Tests: Recent Labs  Lab 02/23/20 1059  AST 21  ALT 19  ALKPHOS 70  BILITOT 0.6  PROT 7.6  ALBUMIN 3.6   No results for input(s): LIPASE, AMYLASE in the last 168 hours. No results for input(s): AMMONIA in the last 168 hours. Coagulation Profile: No results for input(s): INR, PROTIME in the last 168 hours. Cardiac Enzymes: No results for input(s): CKTOTAL, CKMB, CKMBINDEX, TROPONINI in the last 168 hours. BNP (last 3 results) No results for input(s): PROBNP in the last 8760 hours. HbA1C: Recent Labs    02/23/20 1415  HGBA1C 5.7*   CBG: Recent Labs  Lab 02/23/20 1722 02/23/20 2052 02/24/20 0729 02/24/20 1223  GLUCAP 90 97 90 95   Lipid Profile: No results for input(s): CHOL, HDL, LDLCALC, TRIG, CHOLHDL, LDLDIRECT in the last 72 hours. Thyroid Function Tests: No results for input(s): TSH, T4TOTAL, FREET4, T3FREE, THYROIDAB in the last 72 hours. Anemia  Panel: No results for input(s): VITAMINB12, FOLATE, FERRITIN, TIBC, IRON, RETICCTPCT in the last 72 hours. Sepsis Labs: Recent Labs  Lab 02/23/20 1059  LATICACIDVEN 1.4    Recent Results (from the past 240 hour(s))  SARS CORONAVIRUS 2 (TAT 6-24 HRS) Nasopharyngeal Nasopharyngeal Swab     Status: None   Collection Time: 02/23/20  2:16 PM   Specimen: Nasopharyngeal Swab  Result Value Ref Range Status   SARS Coronavirus 2 NEGATIVE NEGATIVE Final    Comment: (NOTE) SARS-CoV-2 target nucleic acids are NOT DETECTED. The SARS-CoV-2 RNA is generally detectable in upper and lower respiratory specimens during the acute phase of infection. Negative results do not preclude SARS-CoV-2 infection, do not rule out co-infections with other pathogens, and should not be used as the sole basis for treatment or other patient management decisions. Negative results must be combined with clinical observations, patient history, and epidemiological information. The expected result is  Negative. Fact Sheet for Patients: HairSlick.no Fact Sheet for Healthcare Providers: quierodirigir.com This test is not yet approved or cleared by the [place] FDA and  has been authorized for detection and/or diagnosis of SARS-CoV-2 by FDA under an Emergency Use Authorization (EUA). This EUA will remain  in effect (meaning this test can be used) for the duration of the COVID-19 declaration under Section 56 4(b)(1) of the Act, 21 U.S.C. section 360bbb-3(b)(1), unless the authorization is terminated or revoked sooner. Performed at [facility], [address], [place], [place] [address]   Culture, blood (routine x 2)     Status: None (Preliminary result)   Collection Time: 02/23/20  2:16 PM   Specimen: BLOOD LEFT ARM  Result Value Ref Range Status   Specimen Description BLOOD LEFT ARM  Final   Special Requests   Final    BOTTLES DRAWN AEROBIC AND ANAEROBIC Blood Culture results may not be optimal due to an inadequate volume of blood received in culture bottles   Culture   Final    NO GROWTH < 24 HOURS Performed at [facility], [address] [place], [place] [address]    Report Status PENDING  Incomplete  Culture, blood (routine x 2)     Status: None (Preliminary result)   Collection Time: 02/23/20  5:04 PM   Specimen: BLOOD LEFT ARM  Result Value Ref Range Status   Specimen Description BLOOD LEFT ARM  Final   Special Requests   Final    BOTTLES DRAWN AEROBIC AND ANAEROBIC Blood Culture adequate volume   Culture   Final    NO GROWTH < 24 HOURS Performed at [facility], [address], [place], [place] [address]    Report Status PENDING  Incomplete         Radiology Studies: [place] LT UPPER EXTREM LTD SOFT TISSUE NON VASCULAR  Result Date: 02/23/2020 CLINICAL DATA:  Spider bite 1 week ago.  Soft tissue infection. EXAM: ULTRASOUND LEFT UPPER EXTREMITY LIMITED TECHNIQUE: Ultrasound examination of  the upper extremity soft tissues was performed in the area of clinical concern. COMPARISON:  None. FINDINGS: Soft tissues about the elbow and medial forearm are evaluated with ultrasound. There is diffuse soft tissue edema, ill-defined. No focal fluid collection or abscess-like collection is demonstrated. IMPRESSION: Ill-defined soft tissue edema/thickening about the elbow and at the level of the medial forearm, but no focal fluid collection or abscess-like collection is identified. Electronically Signed   By: Bary Richard M.D.   On: 02/23/2020 13:29  Scheduled Meds: . enoxaparin (LOVENOX) injection  40 mg Subcutaneous Q24H  . insulin aspart  0-15 Units Subcutaneous TID WC  . insulin aspart  0-5 Units Subcutaneous QHS  . sodium chloride flush  3 mL Intravenous Once   Continuous Infusions: . vancomycin 750 mg (02/24/20 0132)     LOS: 1 day    Time spent: 35 minutes spent on chart review, discussion with nursing staff, consultants, updating family and interview/physical exam; more than 50% of that time was spent in counseling and/or coordination of care.    Alvira Philips [place], DO [facility] Available via Epic secure chat 7am-7pm After these hours, please refer to coverage provider listed on [email or web address] 02/24/2020, 1:10 PM

## 2020-02-25 LAB — GLUCOSE, CAPILLARY: Glucose-Capillary: 75 mg/dL (ref 70–99)

## 2020-02-25 MED ORDER — OXYCODONE-ACETAMINOPHEN 5-325 MG PO TABS: 1.0000 | ORAL_TABLET | Freq: Four times a day (QID) | ORAL | 0 refills | Status: AC | PRN

## 2020-02-25 MED ORDER — CLINDAMYCIN HCL 150 MG PO CAPS: 450.0000 mg | ORAL_CAPSULE | Freq: Three times a day (TID) | ORAL | 0 refills | Status: AC

## 2020-02-25 NOTE — Progress Notes (Signed)
Discharge instructions addressed;Pt in stable condition.Pt is ambulatory and can drive himself home.

## 2020-02-25 NOTE — Discharge Summary (Signed)
Physician Discharge Summary  William Walls QJJ:[id number] DOB: 08/21/1976 DOA: 02/23/2020  PCP: Patient, No Pcp Per  Admit date: 02/23/2020 Discharge date: 02/25/2020  Admitted From: Home Disposition: Home  Recommendations for Outpatient Follow-up:  1. Follow up with PCP in 1-2 weeks 2. Started on clindamycin 4 and 50 mg p.o. 3 times daily x10 days for left arm cellulitis  Home Health: No Equipment/Devices: None  Discharge Condition: Stable CODE STATUS: Full code Diet recommendation: Regular diet  History of present illness:  William Walls is a 44 y.o.malewith medical history significant ofdiabetes mellitus, seizure disorder, tobacco abuse, marijuana abuse presents to emergency department due to worsening left arm pain and swelling since 2 days.   Patient tells me that he had a spider bite 2 days ago and since then he has worsening pain, redness, warmth, swelling of left arm associated with yellowish drainage. No history of fever, chills, generalized weakness, lethargic, IV drug abuse, decreased appetite or weight loss.  Of note: Patient presented with similar symptoms on 01/22/2020 however he left AMA and refused to take antibiotics at that time.  Has history of type 2 diabetes mellitus-however quit taking Metformin since 3 months due to financial issues. Has history of seizure disorder and was on Dilantin 150 mg p.o. 3 times daily however stopped taking it since last 4 years due to Parkinson-like side effects. He tells me that he had noseizure even though he is noncompliant with his AED. He does not have PCP.  No history of headache, blurry vision, chest pain, shortness of breath, palpitation, leg swelling, urinary or bowel changes.  He smokes 1 pack of cigarettes per day, uses marijuana(last intake yesterday)however denies alcohol abuse.  Upon arrival to ED: Patient's vital signs stable, initial labs such as CBC, CMP, lactic acid: WNL. COVID-19 pending. Ultrasound of  left arm shows ill-defined soft tissue edema/thickening about the elbow and at the level of medial forearm. No focal fluid collection or abscess-like collection is identified. Patient received IV fluids and vancomycin in the ED.  Hospital course:  Left arm cellulitis Patient presenting to the ED with continued left arm swelling, pain, or and redness.  Reports possible spider bite roughly 3 weeks ago.  Has not been receiving any antibiotics.  Ultrasound left arm with ill-defined soft tissue edema, thickening at the elbow and medial forearm.  No focal fluid collection or abscess noted.  Patient is afebrile without leukocytosis.  Patient was started on empiric antibiotics with vancomycin and ceftriaxone, with improvement of his symptoms and erythema/swelling.  Blood cultures remain negative during hospitalization, but not yet finalized.  Will discharge home on clindamycin 450 mg p.o. 3 times daily x10 days.  Patient instructed to closely monitor his wound and arm, and to return to the ED if swelling, erythema, or range of motion worsens.  Also discharged on Percocet 5-325 mg every 6 hours as needed for pain control.  Wound care: topical care using xeroform gauze (antimicrobial, nonadherent, astringent) following soap and water cleanse, saline rinse and pat dry. Securement will be with Kerlix roll gauze/paper tape. Elevation  Type 2 diabetes mellitus Previously was on Metformin, has been noncompliant due to poor follow-up.  Hemoglobin A1c 5.7, well controlled.  Continue diet control.  History of seizure disorder Was previously on Dilantin, has been noncompliant for several years.  Outpatient follow-up with neurology.  Polysubstance abuse with tobacco and THC Counseled on need for cessation.  Discharge Diagnoses:  Principal Problem:   Cellulitis of left arm Active Problems:   Tobacco  abuse   Marijuana abuse   Seizures (HCC)   Diabetes mellitus without complication [facility])    Discharge  Instructions  Discharge Instructions    Call MD for:  extreme fatigue   Complete by: As directed    Call MD for:  persistant dizziness or light-headedness   Complete by: As directed    Call MD for:  persistant nausea and vomiting   Complete by: As directed    Call MD for:  redness, tenderness, or signs of infection (pain, swelling, redness, odor or green/yellow discharge around incision site)   Complete by: As directed    Call MD for:  severe uncontrolled pain   Complete by: As directed    Call MD for:  temperature >100.4   Complete by: As directed    Diet - low sodium heart healthy   Complete by: As directed    Increase activity slowly   Complete by: As directed      Allergies as of 02/25/2020   No Known Allergies     Medication List    TAKE these medications   clindamycin 150 MG capsule Commonly known as: Cleocin Take 3 capsules (450 mg total) by mouth 3 (three) times daily for 10 days.   oxyCODONE-acetaminophen 5-325 MG tablet Commonly known as: Percocet Take 1 tablet by mouth every 6 (six) hours as needed for up to 5 days for severe pain.       No Known Allergies  Consultations:  None   Procedures/Studies: [place] LT UPPER EXTREM LTD SOFT TISSUE NON VASCULAR  Result Date: 02/23/2020 CLINICAL DATA:  Spider bite 1 week ago.  Soft tissue infection. EXAM: ULTRASOUND LEFT UPPER EXTREMITY LIMITED TECHNIQUE: Ultrasound examination of the upper extremity soft tissues was performed in the area of clinical concern. COMPARISON:  None. FINDINGS: Soft tissues about the elbow and medial forearm are evaluated with ultrasound. There is diffuse soft tissue edema, ill-defined. No focal fluid collection or abscess-like collection is demonstrated. IMPRESSION: Ill-defined soft tissue edema/thickening about the elbow and at the level of the medial forearm, but no focal fluid collection or abscess-like collection is identified. Electronically Signed   By: Bary Richard M.D.   On: 02/23/2020  13:29      Subjective: Patient seen and examined at bedside, resting comfortably.  Requesting to be discharged home early this morning as he has a court date for tomorrow.  He states his pain is well controlled, swelling/erythema improved.  Able to perform active and passive range of motion of his left elbow without any significant issues.  Patient instructed at bedside regarding signs and symptoms of worsening infection and to return to the ED if necessary.  No other complaints or concerns at this time.  Denies headache, no fever/chills/night sweats, no nausea/vomiting/diarrhea, no chest pain, palpitations, no shortness of breath, no abdominal pain.  No acute events overnight per nursing staff.  Discharge Exam: Vitals:   02/25/20 0444 02/25/20 0733  BP: 131/62 114/84  Pulse: (!) 49 (!) 58  Resp: 18 16  Temp: 97.9 F (36.6 C) 97.7 F (36.5 C)  SpO2: 100% 100%   Vitals:   02/24/20 1420 02/24/20 2002 02/25/20 0444 02/25/20 0733  BP: 120/78 134/74 131/62 114/84  Pulse: 72 (!) 43 (!) 49 (!) 58  Resp: 16 14 18 16   Temp: 98.3 F (36.8 C) 98 F (36.7 C) 97.9 F (36.6 C) 97.7 F (36.5 C)  TempSrc: Oral Oral Oral Oral  SpO2: 98% 99% 100% 100%  Weight:  Height:        General: Pt is alert, awake, not in acute distress Cardiovascular: RRR, S1/S2 +, no rubs, no gallops Respiratory: CTA bilaterally, no wheezing, no rhonchi Abdominal: Soft, NT, ND, bowel sounds + Extremities: no edema, no cyanosis Integumentary:Mild erythema with edema and wounds as depicted below left upper extremity          The results of significant diagnostics from this hospitalization (including imaging, microbiology, ancillary and laboratory) are listed below for reference.     Microbiology: Recent Results (from the past 240 hour(s))  SARS CORONAVIRUS 2 (TAT 6-24 HRS) Nasopharyngeal Nasopharyngeal Swab     Status: None   Collection Time: 02/23/20  2:16 PM   Specimen: Nasopharyngeal Swab  Result  Value Ref Range Status   SARS Coronavirus 2 NEGATIVE NEGATIVE Final    Comment: (NOTE) SARS-CoV-2 target nucleic acids are NOT DETECTED. The SARS-CoV-2 RNA is generally detectable in upper and lower respiratory specimens during the acute phase of infection. Negative results do not preclude SARS-CoV-2 infection, do not rule out co-infections with other pathogens, and should not be used as the sole basis for treatment or other patient management decisions. Negative results must be combined with clinical observations, patient history, and epidemiological information. The expected result is Negative. Fact Sheet for Patients: SugarRoll.be Fact Sheet for Healthcare Providers: https://www.woods-mathews.com/ This test is not yet approved or cleared by the [place] FDA and  has been authorized for detection and/or diagnosis of SARS-CoV-2 by FDA under an Emergency Use Authorization (EUA). This EUA will remain  in effect (meaning this test can be used) for the duration of the COVID-19 declaration under Section 56 4(b)(1) of the Act, 21 U.S.C. section 360bbb-3(b)(1), unless the authorization is terminated or revoked sooner. Performed at [facility], [place] [address], [place], [place] [address]   Culture, blood (routine x 2)     Status: None (Preliminary result)   Collection Time: 02/23/20  2:16 PM   Specimen: BLOOD LEFT ARM  Result Value Ref Range Status   Specimen Description BLOOD LEFT ARM  Final   Special Requests   Final    BOTTLES DRAWN AEROBIC AND ANAEROBIC Blood Culture results may not be optimal due to an inadequate volume of blood received in culture bottles   Culture   Final    NO GROWTH 1 DAY Performed at [facility], [place] [address], [place], [place] [address]    Report Status PENDING  Incomplete  Culture, blood (routine x 2)     Status: None (Preliminary result)   Collection Time: 02/23/20  5:04 PM   Specimen: BLOOD LEFT  ARM  Result Value Ref Range Status   Specimen Description BLOOD LEFT ARM  Final   Special Requests   Final    BOTTLES DRAWN AEROBIC AND ANAEROBIC Blood Culture adequate volume   Culture   Final    NO GROWTH < 24 HOURS Performed at [facility], [place] [address], [place], [place] [address]    Report Status PENDING  Incomplete     Labs: BNP (last 3 results) No results for input(s): BNP in the last 8760 hours. Basic Metabolic Panel: Recent Labs  Lab 02/23/20 1059 02/24/20 0245  NA 138 134*  K 3.5 3.6  CL 103 102  CO2 25 22  GLUCOSE 89 95  BUN 17 14  CREATININE 1.03 0.94  CALCIUM 9.1 8.4*   Liver Function Tests: Recent Labs  Lab 02/23/20 1059  AST 21  ALT 19  ALKPHOS 70  BILITOT 0.6  PROT 7.6  ALBUMIN 3.6   No results for input(s): LIPASE, AMYLASE in the last 168 hours. No results for input(s): AMMONIA in the last 168 hours. CBC: Recent Labs  Lab 02/23/20 1059 02/24/20 0245  WBC 8.4 5.0  NEUTROABS 6.3  --   HGB 14.2 12.7*  HCT 43.4 38.1*  MCV 88.0 86.2  PLT 268 255   Cardiac Enzymes: No results for input(s): CKTOTAL, CKMB, CKMBINDEX, TROPONINI in the last 168 hours. BNP: Invalid input(s): POCBNP CBG: Recent Labs  Lab 02/24/20 0729 02/24/20 1223 02/24/20 1700 02/24/20 2106 02/25/20 0735  GLUCAP 90 95 89 73 75   D-Dimer No results for input(s): DDIMER in the last 72 hours. Hgb A1c Recent Labs    02/23/20 1415  HGBA1C 5.7*   Lipid Profile No results for input(s): CHOL, HDL, LDLCALC, TRIG, CHOLHDL, LDLDIRECT in the last 72 hours. Thyroid function studies No results for input(s): TSH, T4TOTAL, T3FREE, THYROIDAB in the last 72 hours.  Invalid input(s): FREET3 Anemia work up No results for input(s): VITAMINB12, FOLATE, FERRITIN, TIBC, IRON, RETICCTPCT in the last 72 hours. Urinalysis    Component Value Date/Time   COLORURINE YELLOW 07/06/2018 1739   APPEARANCEUR CLEAR 07/06/2018 1739   LABSPEC 1.032 (H) 07/06/2018 1739   PHURINE 5.0  07/06/2018 1739   GLUCOSEU NEGATIVE 07/06/2018 1739   HGBUR NEGATIVE 07/06/2018 1739   BILIRUBINUR NEGATIVE 07/06/2018 1739   KETONESUR NEGATIVE 07/06/2018 1739   PROTEINUR NEGATIVE 07/06/2018 1739   NITRITE NEGATIVE 07/06/2018 1739   LEUKOCYTESUR NEGATIVE 07/06/2018 1739   Sepsis Labs Invalid input(s): PROCALCITONIN,  WBC,  LACTICIDVEN Microbiology Recent Results (from the past 240 hour(s))  SARS CORONAVIRUS 2 (TAT 6-24 HRS) Nasopharyngeal Nasopharyngeal Swab     Status: None   Collection Time: 02/23/20  2:16 PM   Specimen: Nasopharyngeal Swab  Result Value Ref Range Status   SARS Coronavirus 2 NEGATIVE NEGATIVE Final    Comment: (NOTE) SARS-CoV-2 target nucleic acids are NOT DETECTED. The SARS-CoV-2 RNA is generally detectable in upper and lower respiratory specimens during the acute phase of infection. Negative results do not preclude SARS-CoV-2 infection, do not rule out co-infections with other pathogens, and should not be used as the sole basis for treatment or other patient management decisions. Negative results must be combined with clinical observations, patient history, and epidemiological information. The expected result is Negative. Fact Sheet for Patients: HairSlick.nohttps://www.fda.gov/media/138098/download Fact Sheet for Healthcare Providers: quierodirigir.comhttps://www.fda.gov/media/138095/download This test is not yet approved or cleared by the [place] FDA and  has been authorized for detection and/or diagnosis of SARS-CoV-2 by FDA under an Emergency Use Authorization (EUA). This EUA will remain  in effect (meaning this test can be used) for the duration of the COVID-19 declaration under Section 56 4(b)(1) of the Act, 21 U.S.C. section 360bbb-3(b)(1), unless the authorization is terminated or revoked sooner. Performed at [facility], [address] [place], [place] [address]   Culture, blood (routine x 2)     Status: None (Preliminary result)   Collection Time: 02/23/20   2:16 PM   Specimen: BLOOD LEFT ARM  Result Value Ref Range Status   Specimen Description BLOOD LEFT ARM  Final   Special Requests   Final    BOTTLES DRAWN AEROBIC AND ANAEROBIC Blood Culture results may not be optimal due to an inadequate volume of blood received in culture bottles   Culture   Final    NO GROWTH 1 DAY Performed  at [facility], [address] [place], [place] [address]    Report Status PENDING  Incomplete  Culture, blood (routine x 2)     Status: None (Preliminary result)   Collection Time: 02/23/20  5:04 PM   Specimen: BLOOD LEFT ARM  Result Value Ref Range Status   Specimen Description BLOOD LEFT ARM  Final   Special Requests   Final    BOTTLES DRAWN AEROBIC AND ANAEROBIC Blood Culture adequate volume   Culture   Final    NO GROWTH < 24 HOURS Performed at [facility], [address], [place], [place] [address]    Report Status PENDING  Incomplete     Time coordinating discharge: Over 30 minutes  SIGNED:   Alvira Philips [place], DO  [facility] 02/25/2020, 8:40 AM

## 2020-02-25 NOTE — Discharge Instructions (Signed)
Cellulitis, Adult ° °Cellulitis is a skin infection. The infected area is often warm, red, swollen, and sore. It occurs most often in the arms and lower legs. It is very important to get treated for this condition. °What are the causes? °This condition is caused by bacteria. The bacteria enter through a break in the skin, such as a cut, burn, insect bite, open sore, or crack. °What increases the risk? °This condition is more likely to occur in people who: °· Have a weak body defense system (immune system). °· Have open cuts, burns, bites, or scrapes on the skin. °· Are older than 42 years of age. °· Have a blood sugar problem (diabetes). °· Have a long-lasting (chronic) liver disease (cirrhosis) or kidney disease. °· Are very overweight (obese). °· Have a skin problem, such as: °? Itchy rash (eczema). °? Slow movement of blood in the veins (venous stasis). °? Fluid buildup below the skin (edema). °· Have been treated with high-energy rays (radiation). °· Use IV drugs. °What are the signs or symptoms? °Symptoms of this condition include: °· Skin that is: °? Red. °? Streaking. °? Spotting. °? Swollen. °? Sore or painful when you touch it. °? Warm. °· A fever. °· Chills. °· Blisters. °How is this diagnosed? °This condition is diagnosed based on: °· Medical history. °· Physical exam. °· Blood tests. °· Imaging tests. °How is this treated? °Treatment for this condition may include: °· Medicines to treat infections or allergies. °· Home care, such as: °? Rest. °? Placing cold or warm cloths (compresses) on the skin. °· Hospital care, if the condition is very bad. °Follow these instructions at home: °Medicines °· Take over-the-counter and prescription medicines only as told by your doctor. °· If you were prescribed an antibiotic medicine, take it as told by your doctor. Do not stop taking it even if you start to feel better. °General instructions ° °· Drink enough fluid to keep your pee (urine) pale yellow. °· Do not touch  or rub the infected area. °· Raise (elevate) the infected area above the level of your heart while you are sitting or lying down. °· Place cold or warm cloths on the area as told by your doctor. °· Keep all follow-up visits as told by your doctor. This is important. °Contact a doctor if: °· You have a fever. °· You do not start to get better after 1-2 days of treatment. °· Your bone or joint under the infected area starts to hurt after the skin has healed. °· Your infection comes back. This can happen in the same area or another area. °· You have a swollen bump in the area. °· You have new symptoms. °· You feel ill and have muscle aches and pains. °Get help right away if: °· Your symptoms get worse. °· You feel very sleepy. °· You throw up (vomit) or have watery poop (diarrhea) for a long time. °· You see red streaks coming from the area. °· Your red area gets larger. °· Your red area turns dark in color. °These symptoms may represent a serious problem that is an emergency. Do not wait to see if the symptoms will go away. Get medical help right away. Call your local emergency services (911 in the [place] Do not drive yourself to the hospital. °Summary °· Cellulitis is a skin infection. The area is often warm, red, swollen, and sore. °· This condition is treated with medicines, rest, and cold and warm cloths. °· Take all medicines only   as told by your doctor.  Tell your doctor if symptoms do not start to get better after 1-2 days of treatment. This information is not intended to replace advice given to you by your health care provider. Make sure you discuss any questions you have with your health care provider. Document Revised: 03/16/2018 Document Reviewed: 03/16/2018 Elsevier Patient Education  [place], Adult Taking care of your wound properly can help to prevent pain, infection, and scarring. It can also help your wound to heal more quickly. How to care for your wound Wound  care      Follow instructions from your health care provider about how to take care of your wound. Make sure you: ? Wash your hands with soap and water before you change the bandage (dressing). If soap and water are not available, use hand sanitizer. ? Change your dressing as told by your health care provider. ? Leave stitches (sutures), skin glue, or adhesive strips in place. These skin closures may need to stay in place for 2 weeks or longer. If adhesive strip edges start to loosen and curl up, you may trim the loose edges. Do not remove adhesive strips completely unless your health care provider tells you to do that.  Check your wound area every day for signs of infection. Check for: ? Redness, swelling, or pain. ? Fluid or blood. ? Warmth. ? Pus or a bad smell.  Ask your health care provider if you should clean the wound with mild soap and water. Doing this may include: ? Using a clean towel to pat the wound dry after cleaning it. Do not rub or scrub the wound. ? Applying a cream or ointment. Do this only as told by your health care provider. ? Covering the incision with a clean dressing.  Ask your health care provider when you can leave the wound uncovered.  Keep the dressing dry until your health care provider says it can be removed. Do not take baths, swim, use a hot tub, or do anything that would put the wound underwater until your health care provider approves. Ask your health care provider if you can take showers. You may only be allowed to take sponge baths. Medicines   If you were prescribed an antibiotic medicine, cream, or ointment, take or use the antibiotic as told by your health care provider. Do not stop taking or using the antibiotic even if your condition improves.  Take over-the-counter and prescription medicines only as told by your health care provider. If you were prescribed pain medicine, take it 30 or more minutes before you do any wound care or as told by your  health care provider. General instructions  Return to your normal activities as told by your health care provider. Ask your health care provider what activities are safe.  Do not scratch or pick at the wound.  Do not use any products that contain nicotine or tobacco, such as cigarettes and e-cigarettes. These may delay wound healing. If you need help quitting, ask your health care provider.  Keep all follow-up visits as told by your health care provider. This is important.  Eat a diet that includes protein, vitamin A, vitamin C, and other nutrient-rich foods to help the wound heal. ? Foods rich in protein include meat, dairy, beans, nuts, and other sources. ? Foods rich in vitamin A include carrots and dark green, leafy vegetables. ? Foods rich in vitamin C include citrus, tomatoes, and other fruits and vegetables. ?  Nutrient-rich foods have protein, carbohydrates, fat, vitamins, or minerals. Eat a variety of healthy foods including vegetables, fruits, and whole grains. Contact a health care provider if:  You received a tetanus shot and you have swelling, severe pain, redness, or bleeding at the injection site.  Your pain is not controlled with medicine.  You have redness, swelling, or pain around the wound.  You have fluid or blood coming from the wound.  Your wound feels warm to the touch.  You have pus or a bad smell coming from the wound.  You have a fever or chills.  You are nauseous or you vomit.  You are dizzy. Get help right away if:  You have a red streak going away from your wound.  The edges of the wound open up and separate.  Your wound is bleeding, and the bleeding does not stop with gentle pressure.  You have a rash.  You faint.  You have trouble breathing. Summary  Always wash your hands with soap and water before changing your bandage (dressing).  To help with healing, eat foods that are rich in protein, vitamin A, vitamin C, and other  nutrients.  Check your wound every day for signs of infection. Contact your health care provider if you suspect that your wound is infected. This information is not intended to replace advice given to you by your health care provider. Make sure you discuss any questions you have with your health care provider. Document Revised: 02/12/2019 Document Reviewed: 05/11/2016 Elsevier Patient Education  2020 [employer].

## 2020-02-28 LAB — CULTURE, BLOOD (ROUTINE X 2)
Culture: NO GROWTH
Culture: NO GROWTH
Special Requests: ADEQUATE

## 2021-01-24 IMAGING — US US EXTREM UP*L* LTD
2 series · 13 of 13 positions shown · non-contrast
Comparison: None.

CLINICAL DATA: Spider bite 1 week ago.  Soft tissue infection.

EXAM:
ULTRASOUND LEFT UPPER EXTREMITY LIMITED
TECHNIQUE: Ultrasound examination of the upper extremity soft tissues was
performed in the area of clinical concern.

[Series 1: us soft tissue upper extremity limited left (non-v · 10 acquisitions, 10 frames shown]
[im 1/10]
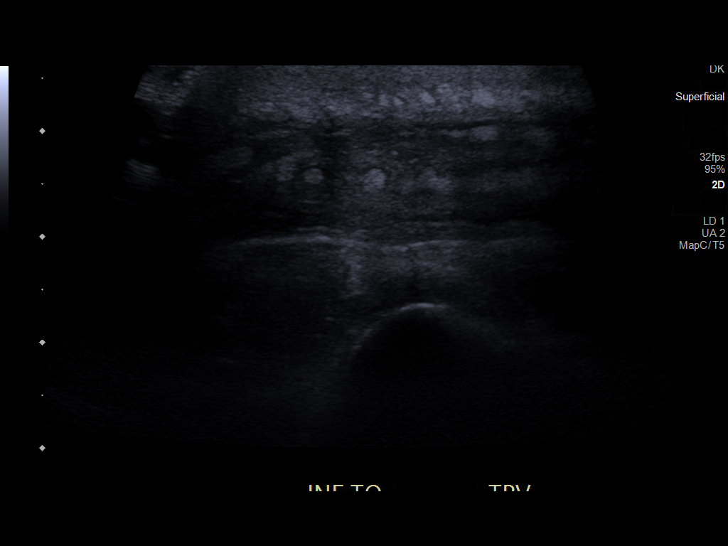
[im 2/10]
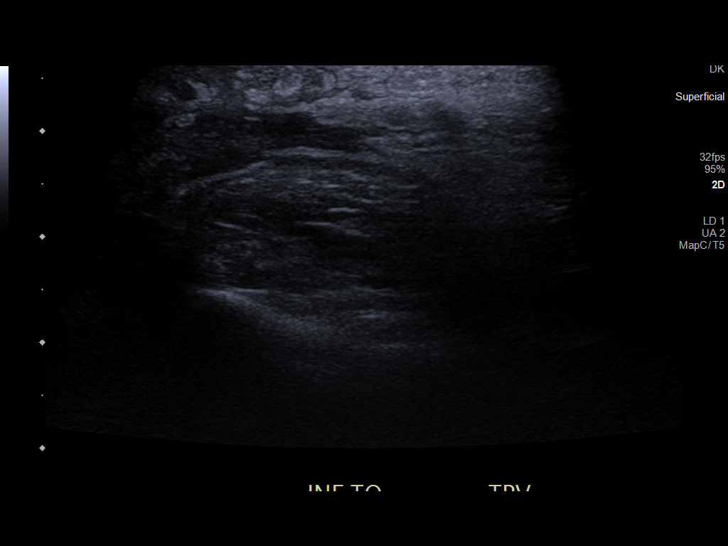
[im 3/10]
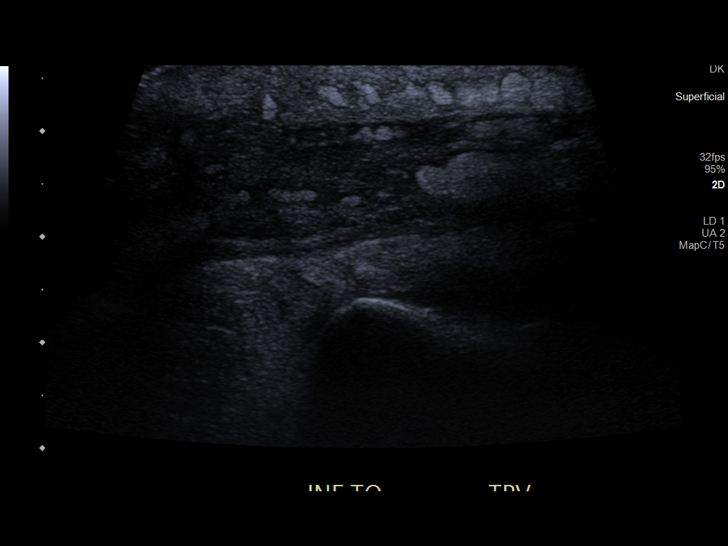
[im 4/10]
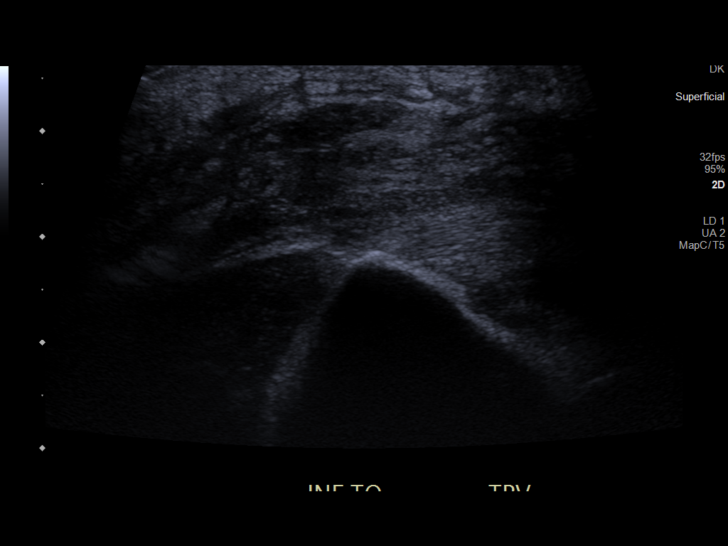
[im 5/10]
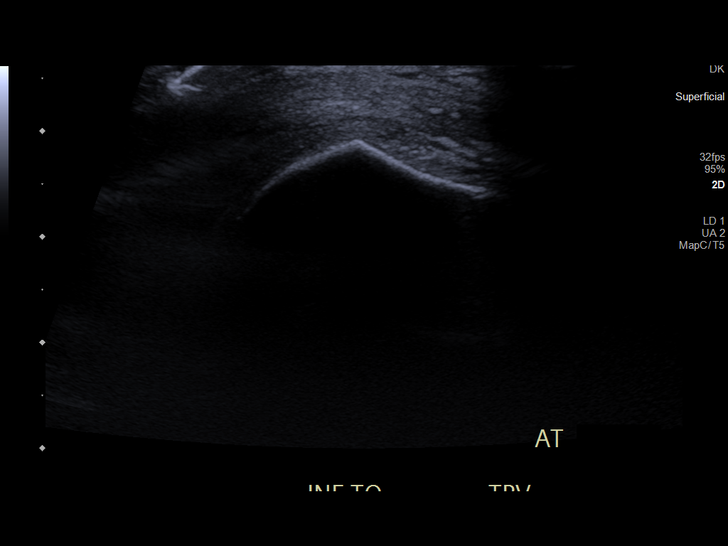
[im 6/10]
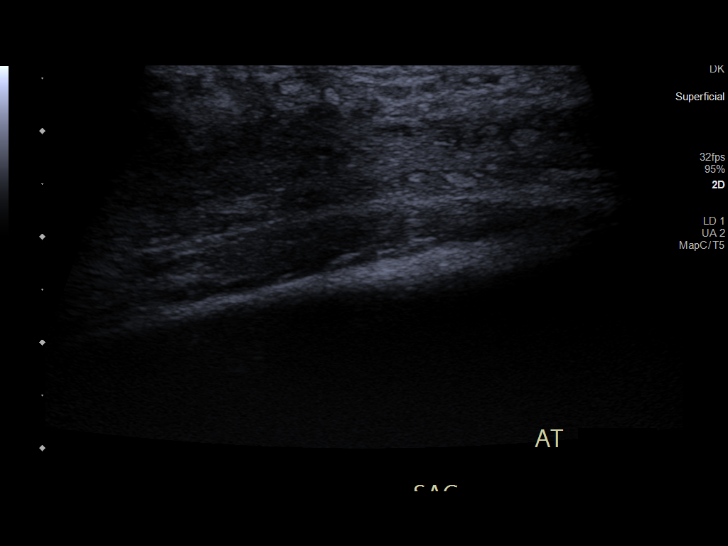
[im 7/10]
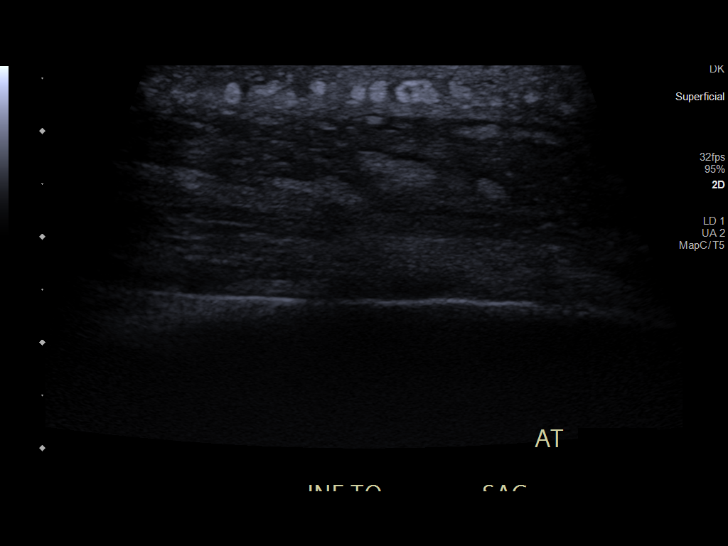
[im 8/10]
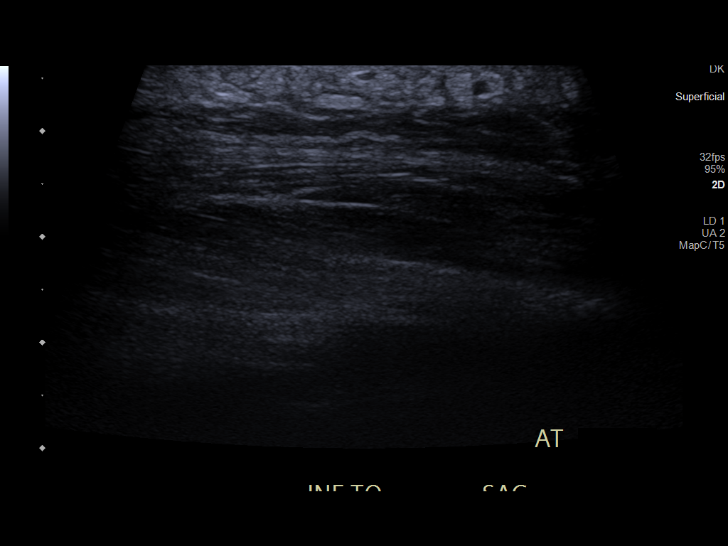
[im 9/10]
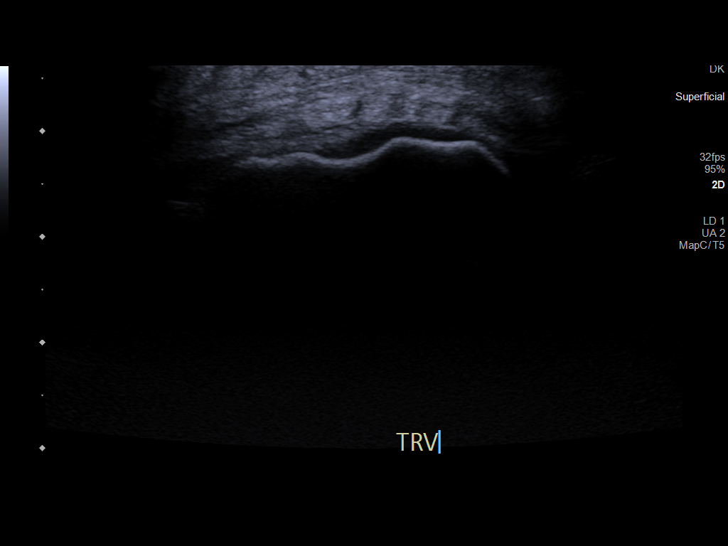
[im 10/10]
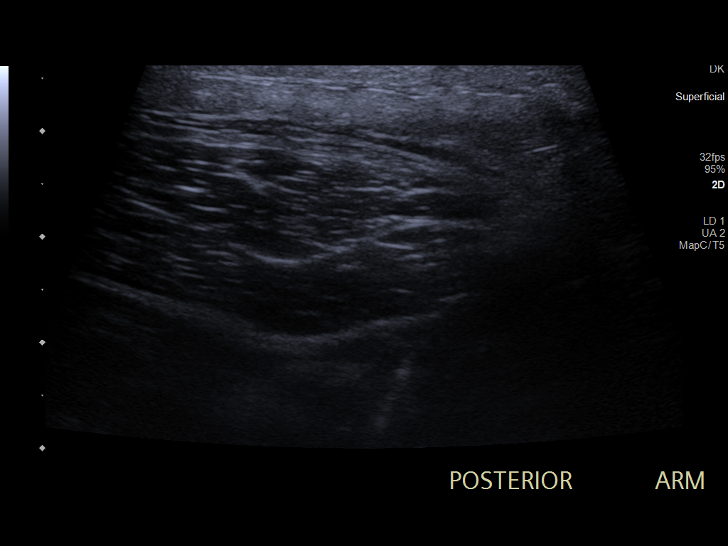

[Series 1001: superficial · 3 of 3 slices shown]
[im 1/3]
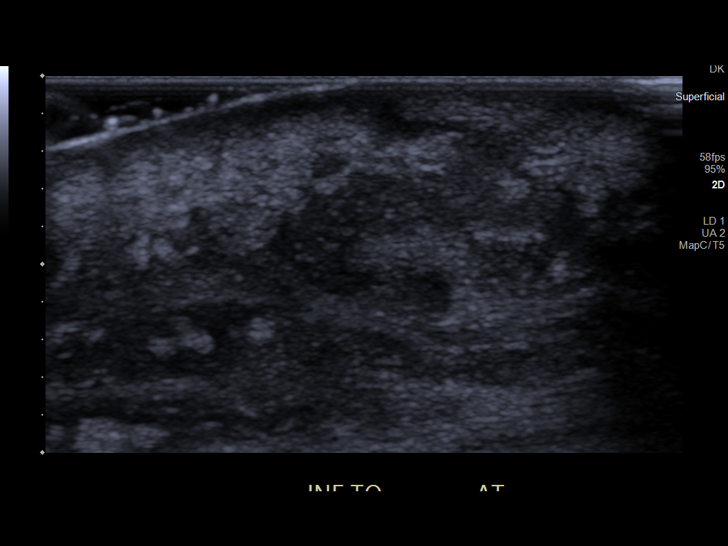
[im 2/3]
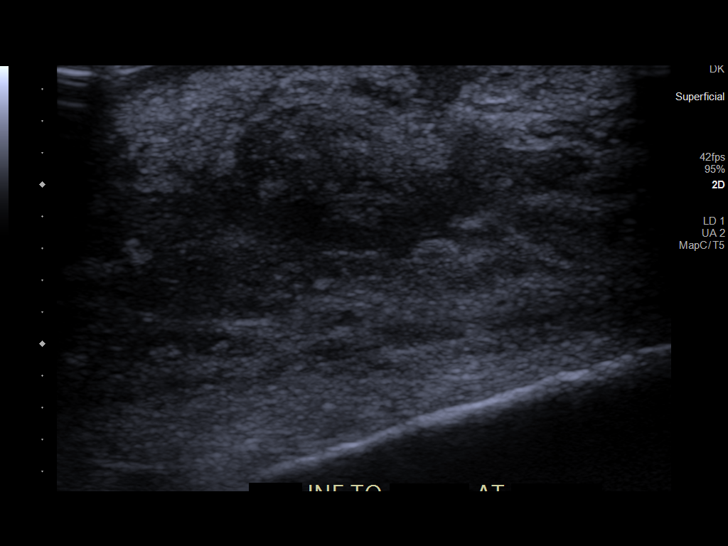
[im 3/3]
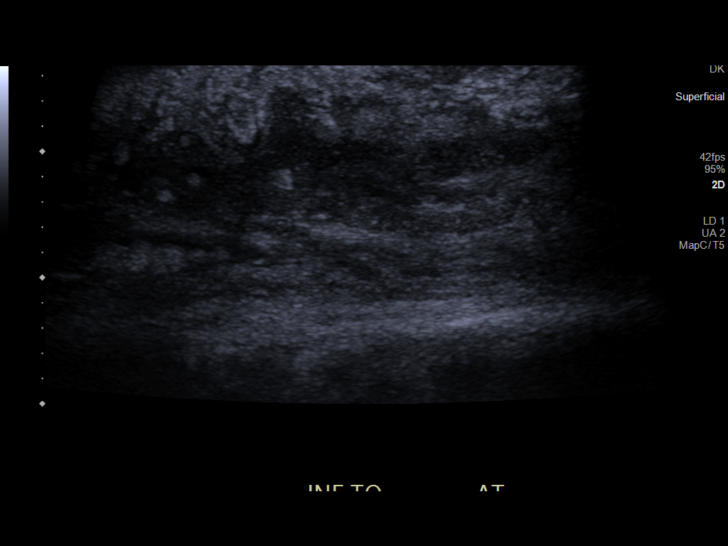

[13 of 13 positions shown; findings below may reference images not displayed]

FINDINGS: Soft tissues about the elbow and medial forearm are evaluated with
ultrasound. There is diffuse soft tissue edema, ill-defined. No
focal fluid collection or abscess-like collection is demonstrated.
IMPRESSION: Ill-defined soft tissue edema/thickening about the elbow and at the
level of the medial forearm, but no focal fluid collection or
abscess-like collection is identified.

## 2021-12-08 ENCOUNTER — Emergency Department (HOSPITAL_COMMUNITY)
Admission: EM | Admit: 2021-12-08 | Discharge: 2021-12-09 | Disposition: A | Discharge status: Home / Self Care | Payer: Medicare HMO | Attending: Emergency Medicine | Admitting: Emergency Medicine

## 2021-12-08 DIAGNOSIS — H538 Other visual disturbances: Secondary | ICD-10-CM

## 2021-12-08 NOTE — ED Provider Triage Note (Signed)
Emergency Medicine Provider Triage Evaluation Note  William Walls , a 46 y.o. male  was evaluated in triage.  Pt complains of decreased vision in right eye for several weeks.  Patient was seen at a hospital in [place] for cataracts in his right eye, but they stated they could not do the procedure because his Medicaid was not eligible there.  He has had no changes.  He has no pain.  Review of Systems  Positive: Vision change Negative: Eye pain  Physical Exam  BP 126/71 (BP Location: Right Arm)    Pulse (!) 52    Temp 98.1 F (36.7 C) (Oral)    Resp 16    SpO2 100%  Gen:   Awake, no distress   Resp:  Normal effort  MSK:   Moves extremities without difficulty  Other:    Medical Decision Making  Medically screening exam initiated at 8:09 PM.  Appropriate orders placed.  William Walls was informed that the remainder of the evaluation will be completed by another provider, this initial triage assessment does not replace that evaluation, and the importance of remaining in the ED until their evaluation is complete.     William Loud T, PA-C 12/08/21 2010

## 2021-12-08 NOTE — ED Triage Notes (Signed)
Pt c/o cataract in R eye, blurry x2wks. Pt states he was seen in [place], but that he has to be seen down in [place] for removal so medicaid will cover it. NAD in triage, pt denies pain

## 2021-12-09 DIAGNOSIS — H538 Other visual disturbances: Secondary | ICD-10-CM | POA: Diagnosis not present

## 2021-12-09 LAB — CBG MONITORING, ED: Glucose-Capillary: 110 mg/dL — ABNORMAL HIGH (ref 70–99)

## 2021-12-09 NOTE — ED Provider Notes (Signed)
[facility] EMERGENCY DEPARTMENT Provider Note   CSN: [id number] Arrival date & time: 12/08/21  1923     History  Chief Complaint  Patient presents with   Loss of Vision    R   Blurred Vision    Nachman Guttery is a 46 y.o. male.  HPI Patient is a 46 year old male who presents to the emergency department for evaluation of right eye blurriness.  Patient states it began about 2 weeks ago.  He states that he was evaluated in the emergency department in [facility] and was diagnosed with a cataract.  Because he has Medicaid in [place] they recommended that he come to [facility] for immediate evaluation with ophthalmology.  Denies any eye pain or left eye symptoms.  No numbness, weakness, or headaches.    Home Medications Prior to Admission medications   Not on File      Allergies    Patient has no known allergies.    Review of Systems   Review of Systems  Eyes:  Positive for visual disturbance. Negative for photophobia, pain, discharge, redness and itching.  Neurological:  Negative for weakness, numbness and headaches.   Physical Exam Updated Vital Signs BP 126/71 (BP Location: Right Arm)    Pulse (!) 52    Temp 98.1 F (36.7 C) (Oral)    Resp 16    SpO2 100%  Physical Exam Vitals and nursing note reviewed.  Constitutional:      General: He is not in acute distress.    Appearance: He is well-developed.  HENT:     Head: Normocephalic and atraumatic.     Right Ear: External ear normal.     Left Ear: External ear normal.  Eyes:     General: No scleral icterus.       Right eye: No discharge.        Left eye: No discharge.     Extraocular Movements: Extraocular movements intact.     Conjunctiva/sclera: Conjunctivae normal.     Comments: Extraocular movements intact.  Left eye pupil is round and reactive to light.  Right pupil is discolored and hazy, consistent with a cataract.  Visual fields grossly intact in the left eye.   Neck:      Trachea: No tracheal deviation.  Cardiovascular:     Rate and Rhythm: Normal rate.  Pulmonary:     Effort: Pulmonary effort is normal. No respiratory distress.     Breath sounds: No stridor.  Abdominal:     General: There is no distension.  Musculoskeletal:        General: No swelling or deformity.     Cervical back: Neck supple.  Skin:    General: Skin is warm and dry.     Findings: No rash.  Neurological:     General: No focal deficit present.     Mental Status: He is alert and oriented to person, place, and time.     Cranial Nerves: Cranial nerve deficit: no gross deficits.     Comments: Speaking clearly, coherently, and in complete sentences.  Moving all 4 extremities with ease.  Ambulatory with steady gait.  No gross deficits.    ED Results / Procedures / Treatments   Labs (all labs ordered are listed, but only abnormal results are displayed) Labs Reviewed - No data to display  EKG None  Radiology No results found.  Procedures Procedures    Medications Ordered in ED Medications - No data to display  ED Course/ Medical Decision Making/ A&P                           Medical Decision Making Patient is a 46 year old male who presents to the emergency department with what appears to be a cataract in the right eye.  He was initially evaluated 2 weeks ago in [place] with blurry vision.  He was diagnosed with a cataract but because he has Medicaid in [place] they recommended that he come to [facility] for ophthalmology evaluation and corrective surgery.  On my exam I agree that the right eye appears consistent with cataract.  Otherwise patient has no complaints.  No numbness, weakness, slurred speech, headaches.  No eye pain or eye discharge.  Speaking clearly and coherently.  No gross deficits.  Ambulatory with a steady gait.  Feel that the patient is stable for discharge at this time and he is agreeable.  Discussed return precautions.  Patient given a  referral to ophthalmology and recommendation to call them later this morning.  His questions were answered and he was amicable at the time of discharge. Final Clinical Impression(s) / ED Diagnoses Final diagnoses:  Blurry vision   Rx / DC Orders ED Discharge Orders     None         Rayna Sexton, PA-C 12/09/21 0242    Merryl Hacker, MD 12/09/21 [phone]

## 2021-12-09 NOTE — Discharge Instructions (Addendum)
Below is the contact information for Dr. Sherrine Maples.  He is a local ophthalmologist.  Please give them a call and schedule an appointment for reevaluation.  If you develop any new or worsening symptoms please come back to the emergency department.

## 2022-02-01 ENCOUNTER — Other Ambulatory Visit (HOSPITAL_COMMUNITY): Payer: Self-pay

## 2022-06-02 ENCOUNTER — Ambulatory Visit (INDEPENDENT_AMBULATORY_CARE_PROVIDER_SITE_OTHER): Payer: Medicare HMO | Admitting: Family

## 2022-06-02 ENCOUNTER — Encounter: Payer: Self-pay | Admitting: Family

## 2022-06-02 VITALS — BP 110/80 | HR 58 | Temp 96.8°F | Resp 16 | Ht 66.0 in | Wt 205.4 lb

## 2022-06-02 DIAGNOSIS — R569 Unspecified convulsions: Secondary | ICD-10-CM | POA: Diagnosis not present

## 2022-06-02 DIAGNOSIS — Z13220 Encounter for screening for lipoid disorders: Secondary | ICD-10-CM

## 2022-06-02 DIAGNOSIS — E1136 Type 2 diabetes mellitus with diabetic cataract: Secondary | ICD-10-CM | POA: Diagnosis not present

## 2022-06-02 DIAGNOSIS — Z01818 Encounter for other preprocedural examination: Secondary | ICD-10-CM | POA: Diagnosis not present

## 2022-06-02 DIAGNOSIS — Z7689 Persons encountering health services in other specified circumstances: Secondary | ICD-10-CM | POA: Diagnosis not present

## 2022-06-02 DIAGNOSIS — Z1159 Encounter for screening for other viral diseases: Secondary | ICD-10-CM

## 2022-06-02 DIAGNOSIS — Z113 Encounter for screening for infections with a predominantly sexual mode of transmission: Secondary | ICD-10-CM
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
## 2022-06-03 ENCOUNTER — Other Ambulatory Visit: Payer: Medicare HMO

## 2022-06-03 LAB — MICROALBUMIN / CREATININE URINE RATIO
Creatinine, Urine: 192 mg/dL (ref 20–320)
Microalb, Ur: <0.2 mg/dL

## 2022-06-03 LAB — LIPID PANEL
Cholesterol: 136 mg/dL (ref <200)
HDL: 49 mg/dL (ref >=40)
LDL Cholesterol (Calc): 74 mg/dL (calc)
Non-HDL Cholesterol (Calc): 87 mg/dL (calc) (ref <130)
Total CHOL/HDL Ratio: 2.8 (calc) (ref <5.0)
Triglycerides: 52 mg/dL (ref <150)

## 2022-06-03 LAB — COMPLETE METABOLIC PANEL WITH GFR
AG Ratio: 1.4 (calc) (ref 1.0–2.5)
ALT: 24 U/L (ref 9–46)
AST: 24 U/L (ref 10–40)
Albumin: 3.9 g/dL (ref 3.6–5.1)
Alkaline phosphatase (APISO): 54 U/L (ref 36–130)
BUN: 17 mg/dL (ref 7–25)
CO2: 25 mmol/L (ref 20–32)
Calcium: 8.8 mg/dL (ref 8.6–10.3)
Chloride: 107 mmol/L (ref 98–110)
Creat: 0.94 mg/dL (ref 0.60–1.29)
Globulin: 2.8 g/dL (calc) (ref 1.9–3.7)
Glucose, Bld: 76 mg/dL (ref 65–99)
Potassium: 4.2 mmol/L (ref 3.5–5.3)
Sodium: 141 mmol/L (ref 135–146)
Total Bilirubin: 0.5 mg/dL (ref 0.2–1.2)
Total Protein: 6.7 g/dL (ref 6.1–8.1)
eGFR: 103 mL/min/{1.73_m2} (ref >=60)

## 2022-06-03 LAB — C. TRACHOMATIS/N. GONORRHOEAE RNA
C. trachomatis RNA, TMA: NOT DETECTED
N. gonorrhoeae RNA, TMA: NOT DETECTED

## 2022-06-10 ENCOUNTER — Other Ambulatory Visit: Payer: Medicare Other

## 2022-06-11 ENCOUNTER — Encounter: Payer: Self-pay | Admitting: Neurology

## 2022-06-11 LAB — CBC WITH DIFFERENTIAL/PLATELET
Absolute Monocytes: 437 cells/uL (ref 200–950)
Basophils Absolute: 18 cells/uL (ref 0–200)
Basophils Relative: 0.4 %
Eosinophils Absolute: 81 cells/uL (ref 15–500)
Eosinophils Relative: 1.8 %
HCT: 40.9 % (ref 38.5–50.0)
Hemoglobin: 13.6 g/dL (ref 13.2–17.1)
Lymphs Abs: 1508 cells/uL (ref 850–3900)
MCH: 28.6 pg (ref 27.0–33.0)
MCHC: 33.3 g/dL (ref 32.0–36.0)
MCV: 85.9 fL (ref 80.0–100.0)
MPV: 11 fL (ref 7.5–12.5)
Monocytes Relative: 9.7 %
Neutro Abs: 2457 cells/uL (ref 1500–7800)
Neutrophils Relative %: 54.6 %
Platelets: 212 10*3/uL (ref 140–400)
RBC: 4.76 10*6/uL (ref 4.20–5.80)
RDW: 14.1 % (ref 11.0–15.0)
Total Lymphocyte: 33.5 %
WBC: 4.5 10*3/uL (ref 3.8–10.8)

## 2022-06-11 LAB — HEPATITIS C ANTIBODY: Hepatitis C Ab: NONREACTIVE

## 2022-06-11 LAB — HIV ANTIBODY (ROUTINE TESTING W REFLEX): HIV 1&2 Ab, 4th Generation: NONREACTIVE

## 2022-06-11 LAB — TSH: TSH: 0.39 mIU/L — ABNORMAL LOW (ref 0.40–4.50)

## 2022-06-11 LAB — RPR: RPR Ser Ql: NONREACTIVE

## 2022-06-14 ENCOUNTER — Ambulatory Visit: Payer: Medicare HMO | Admitting: Neurology

## 2022-06-16 LAB — GLUCOSE, POCT (MANUAL RESULT ENTRY): POC Glucose: 142 mg/dl — AB (ref 70–99)

## 2022-06-23 ENCOUNTER — Ambulatory Visit (INDEPENDENT_AMBULATORY_CARE_PROVIDER_SITE_OTHER): Payer: Medicare Other | Admitting: Podiatry

## 2022-06-23 DIAGNOSIS — Z91199 Patient's noncompliance with other medical treatment and regimen due to unspecified reason: Secondary | ICD-10-CM

## 2022-06-23 NOTE — Progress Notes (Signed)
No show

## 2022-07-07 ENCOUNTER — Encounter: Payer: Self-pay | Admitting: Neurology

## 2022-07-07 ENCOUNTER — Ambulatory Visit (INDEPENDENT_AMBULATORY_CARE_PROVIDER_SITE_OTHER): Payer: Medicare Other | Admitting: Neurology

## 2022-07-07 VITALS — BP 119/75 | HR 45 | Ht 66.0 in | Wt 199.8 lb

## 2022-07-07 DIAGNOSIS — Z87898 Personal history of other specified conditions: Secondary | ICD-10-CM | POA: Diagnosis not present

## 2022-07-07 DIAGNOSIS — R270 Ataxia, unspecified: Secondary | ICD-10-CM

## 2022-07-07 NOTE — Patient Instructions (Signed)
Good to meet you.  Schedule MRI brain with and without contrast  2. Please have your eye doctor send the surgery form so we can fill out for you  3. Follow-up in 6 months, call for any changes
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
## 2022-07-28 DIAGNOSIS — Z79899 Other long term (current) drug therapy: Secondary | ICD-10-CM | POA: Diagnosis not present

## 2022-07-28 DIAGNOSIS — J988 Other specified respiratory disorders: Secondary | ICD-10-CM | POA: Diagnosis not present

## 2022-07-28 DIAGNOSIS — Z0000 Encounter for general adult medical examination without abnormal findings: Secondary | ICD-10-CM | POA: Diagnosis not present

## 2022-07-28 DIAGNOSIS — E559 Vitamin D deficiency, unspecified: Secondary | ICD-10-CM | POA: Diagnosis not present

## 2022-07-28 DIAGNOSIS — Z0130 Encounter for examination of blood pressure without abnormal findings: Secondary | ICD-10-CM | POA: Diagnosis not present

## 2022-07-28 DIAGNOSIS — R5383 Other fatigue: Secondary | ICD-10-CM | POA: Diagnosis not present

## 2022-07-28 DIAGNOSIS — M791 Myalgia, unspecified site: Secondary | ICD-10-CM | POA: Diagnosis not present

## 2022-07-28 DIAGNOSIS — R001 Bradycardia, unspecified: Secondary | ICD-10-CM | POA: Diagnosis not present

## 2022-07-28 DIAGNOSIS — R6883 Chills (without fever): Secondary | ICD-10-CM | POA: Diagnosis not present

## 2022-07-29 DIAGNOSIS — Z79899 Other long term (current) drug therapy: Secondary | ICD-10-CM | POA: Diagnosis not present

## 2022-07-29 DIAGNOSIS — R7303 Prediabetes: Secondary | ICD-10-CM | POA: Diagnosis not present

## 2022-07-29 DIAGNOSIS — M549 Dorsalgia, unspecified: Secondary | ICD-10-CM | POA: Diagnosis not present

## 2022-07-29 DIAGNOSIS — R892 Abnormal level of other drugs, medicaments and biological substances in specimens from other organs, systems and tissues: Secondary | ICD-10-CM | POA: Diagnosis not present

## 2022-07-29 DIAGNOSIS — Z0130 Encounter for examination of blood pressure without abnormal findings: Secondary | ICD-10-CM | POA: Diagnosis not present

## 2022-08-26 ENCOUNTER — Ambulatory Visit: Payer: Medicare Other | Admitting: Podiatry

## 2022-08-28 DIAGNOSIS — L089 Local infection of the skin and subcutaneous tissue, unspecified: Secondary | ICD-10-CM | POA: Diagnosis not present

## 2022-08-28 DIAGNOSIS — M79672 Pain in left foot: Secondary | ICD-10-CM | POA: Diagnosis not present

## 2022-08-28 DIAGNOSIS — E119 Type 2 diabetes mellitus without complications: Secondary | ICD-10-CM | POA: Diagnosis not present

## 2022-08-28 DIAGNOSIS — F17210 Nicotine dependence, cigarettes, uncomplicated: Secondary | ICD-10-CM | POA: Diagnosis not present

## 2022-10-04 ENCOUNTER — Encounter: Payer: Medicare HMO | Admitting: Family

## 2022-10-04 NOTE — Progress Notes (Signed)
  This encounter was created in error - please disregard. No show 

## 2022-10-26 DIAGNOSIS — Z20822 Contact with and (suspected) exposure to covid-19: Secondary | ICD-10-CM | POA: Diagnosis not present

## 2022-10-26 DIAGNOSIS — J029 Acute pharyngitis, unspecified: Secondary | ICD-10-CM | POA: Diagnosis not present

## 2022-10-26 DIAGNOSIS — R5383 Other fatigue: Secondary | ICD-10-CM | POA: Diagnosis not present

## 2022-10-29 DIAGNOSIS — U071 COVID-19: Secondary | ICD-10-CM | POA: Diagnosis not present

## 2022-10-29 DIAGNOSIS — J069 Acute upper respiratory infection, unspecified: Secondary | ICD-10-CM | POA: Diagnosis not present

## 2022-11-01 DIAGNOSIS — E119 Type 2 diabetes mellitus without complications: Secondary | ICD-10-CM | POA: Diagnosis not present

## 2022-11-01 DIAGNOSIS — H547 Unspecified visual loss: Secondary | ICD-10-CM | POA: Diagnosis not present

## 2022-11-01 DIAGNOSIS — J029 Acute pharyngitis, unspecified: Secondary | ICD-10-CM | POA: Diagnosis not present

## 2022-11-01 DIAGNOSIS — H5461 Unqualified visual loss, right eye, normal vision left eye: Secondary | ICD-10-CM | POA: Diagnosis not present

## 2022-11-01 DIAGNOSIS — F17210 Nicotine dependence, cigarettes, uncomplicated: Secondary | ICD-10-CM | POA: Diagnosis not present

## 2022-11-01 DIAGNOSIS — Z1152 Encounter for screening for COVID-19: Secondary | ICD-10-CM | POA: Diagnosis not present

## 2023-01-26 ENCOUNTER — Encounter: Payer: Self-pay | Admitting: Neurology

## 2023-01-26 ENCOUNTER — Ambulatory Visit: Payer: Medicare Other | Admitting: Neurology

## 2023-02-17 ENCOUNTER — Telehealth: Payer: Self-pay

## 2023-02-17 NOTE — Telephone Encounter (Signed)
Patient due for DM follow up. Called patient but his Vm is full.

## 2023-03-14 ENCOUNTER — Emergency Department (HOSPITAL_COMMUNITY): Payer: Medicare Other

## 2023-03-14 ENCOUNTER — Emergency Department (HOSPITAL_COMMUNITY)
Admission: EM | Admit: 2023-03-14 | Discharge: 2023-03-14 | Discharge status: Against Medical Advice | Payer: Medicare Other | Attending: Emergency Medicine | Admitting: Emergency Medicine

## 2023-03-14 ENCOUNTER — Encounter (HOSPITAL_COMMUNITY): Payer: Self-pay

## 2023-03-14 ENCOUNTER — Other Ambulatory Visit: Payer: Self-pay

## 2023-03-14 DIAGNOSIS — R270 Ataxia, unspecified: Secondary | ICD-10-CM | POA: Insufficient documentation

## 2023-03-14 DIAGNOSIS — Z87898 Personal history of other specified conditions: Secondary | ICD-10-CM

## 2023-03-14 DIAGNOSIS — R569 Unspecified convulsions: Secondary | ICD-10-CM | POA: Diagnosis not present

## 2023-03-14 DIAGNOSIS — R519 Headache, unspecified: Secondary | ICD-10-CM | POA: Diagnosis present
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
## 2023-03-14 NOTE — ED Notes (Signed)
RN informed by lobby tech patient informed her he had somewhere to be and couldn't wait. Lobby Tech witnessed patient walk out of lobby.
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
## 2023-04-28 ENCOUNTER — Inpatient Hospital Stay: Admit: 2023-04-28 | Discharge: 2023-04-28 | Disposition: A | Discharge status: Home / Self Care | Payer: MEDICARE

## 2023-04-28 ENCOUNTER — Emergency Department: Admit: 2023-04-28 | Payer: MEDICARE

## 2023-04-28 DIAGNOSIS — R079 Chest pain, unspecified: Secondary | ICD-10-CM

## 2023-04-28 LAB — CBC WITH AUTO DIFFERENTIAL
Band Neutrophils: 1 % (ref 0–6)
Basophils %: 0 % (ref 0–1)
Basophils Absolute: 0 10*3/uL (ref 0.0–0.1)
Eosinophils %: 0 % (ref 0–7)
Eosinophils Absolute: 0 10*3/uL (ref 0.0–0.4)
Hematocrit: 40.3 % (ref 36.6–50.3)
Hemoglobin: 13.1 g/dL (ref 12.1–17.0)
Immature Granulocytes %: 0 %
Immature Granulocytes Absolute: 0 10*3/uL
Lymphocytes %: 43 % (ref 12–49)
Lymphocytes Absolute: 2.1 10*3/uL (ref 0.8–3.5)
MCH: 26.7 PG (ref 26.0–34.0)
MCHC: 32.5 g/dL (ref 30.0–36.5)
MCV: 82.1 FL (ref 80.0–99.0)
MPV: 10.4 FL (ref 8.9–12.9)
Monocytes %: 9 % (ref 5–13)
Monocytes Absolute: 0.4 10*3/uL (ref 0.0–1.0)
Neutrophils %: 47 % (ref 32–75)
Neutrophils Absolute: 2.4 10*3/uL (ref 1.8–8.0)
Nucleated RBCs: 0 PER 100 WBC
Platelets: 253 10*3/uL (ref 150–400)
RBC: 4.91 M/uL (ref 4.10–5.70)
RDW: 17.9 % — ABNORMAL HIGH (ref 11.5–14.5)
WBC: 4.9 10*3/uL (ref 4.1–11.1)
nRBC: 0 10*3/uL (ref 0.00–0.01)

## 2023-04-28 LAB — TROPONIN
Troponin, High Sensitivity: 6 ng/L (ref 0–76)
Troponin, High Sensitivity: 6 ng/L (ref 0–76)

## 2023-04-28 LAB — BASIC METABOLIC PANEL
Anion Gap: 5 mmol/L (ref 5–15)
BUN/Creatinine Ratio: 22 — ABNORMAL HIGH (ref 12–20)
BUN: 19 mg/dL (ref 6–20)
CO2: 27 mmol/L (ref 21–32)
Calcium: 9 mg/dL (ref 8.5–10.1)
Chloride: 111 mmol/L — ABNORMAL HIGH (ref 97–108)
Creatinine: 0.87 mg/dL (ref 0.70–1.30)
Est, Glom Filt Rate: >90 mL/min/{1.73_m2} (ref >60)
Glucose: 83 mg/dL (ref 65–100)
Potassium: 4.6 mmol/L (ref 3.5–5.1)
Sodium: 143 mmol/L (ref 136–145)

## 2023-04-28 MED ORDER — METHOCARBAMOL 750 MG PO TABS
750 MG | ORAL_TABLET | Freq: Four times a day (QID) | ORAL | 0 refills | Status: DC | PRN
Start: 2023-04-28 — End: 2023-04-28

## 2023-04-28 MED ORDER — METHOCARBAMOL 750 MG PO TABS
750 | Freq: Once | ORAL | Status: AC
Start: 2023-04-28 — End: 2023-04-28
  Administered 2023-04-28: 18:00:00 1500 mg via ORAL

## 2023-04-28 MED ORDER — IBUPROFEN 600 MG PO TABS
600 MG | ORAL_TABLET | Freq: Four times a day (QID) | ORAL | 0 refills | Status: DC | PRN
Start: 2023-04-28 — End: 2023-04-28

## 2023-04-28 MED ORDER — METHOCARBAMOL 750 MG PO TABS
750 MG | ORAL_TABLET | Freq: Four times a day (QID) | ORAL | 0 refills | Status: AC | PRN
Start: 2023-04-28 — End: 2023-05-08

## 2023-04-28 MED ORDER — IBUPROFEN 400 MG PO TABS
400 | ORAL | Status: AC
Start: 2023-04-28 — End: 2023-04-28
  Administered 2023-04-28: 17:00:00 400 mg via ORAL

## 2023-04-28 MED ORDER — IBUPROFEN 600 MG PO TABS
600 MG | ORAL_TABLET | Freq: Four times a day (QID) | ORAL | 0 refills | Status: AC | PRN
Start: 2023-04-28 — End: ?

## 2023-04-28 MED ORDER — METFORMIN HCL ER 500 MG PO TB24
500 MG | ORAL_TABLET | Freq: Every day | ORAL | 1 refills | Status: AC
Start: 2023-04-28 — End: ?

## 2023-04-28 MED ORDER — PHENYTOIN SODIUM EXTENDED 100 MG PO CAPS
100 MG | ORAL_CAPSULE | Freq: Three times a day (TID) | ORAL | 1 refills | Status: AC
Start: 2023-04-28 — End: ?

## 2023-04-28 MED FILL — IBUPROFEN 400 MG PO TABS: 400 MG | ORAL | Qty: 1

## 2023-04-28 MED FILL — METHOCARBAMOL 750 MG PO TABS: 750 MG | ORAL | Qty: 2
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
## 2023-04-28 NOTE — Discharge Instructions (Signed)
[facility] in [place]       [facility]:  [address] [place], [place] [address].  [phone]  Phylliss Blakes, MD Family Medicine  Linnell Fulling, MD Family Medicine  Angelene Giovanni, MD Family Medicine    [address] Family Medicine: [address], [place], [place] [address]. [phone]   Joyice Faster, MD Family Medicine  Charlesetta Shanks, NP Family Medicine  Gari Crown, NP Family Medicine    [facility]: [address] [place] [address], [place] , [place] [address]. [phone]  Yvonne Kendall, MD Family Medicine  Eugene Garnet, MD Family Medicine  Arta Bruce, NP Family Medicine  Mary Sella, NP Family Medicine    [facility]: [address], [address], [place] [address]. [phone]  Herma Mering, MD Internal Medicine  Lizabeth Leyden, MD Internal Medicine  Burna Sis, MD Internal Medicine  Tomasa Rand, [place] Family Medicine    [facility]: [address], [place], [place] [address]. [phone]  Bonner Puna. Ladona Ridgel, MD Family Medicine  Ishmael Holter, MD Family Medicine  Loraine Leriche A. Germaine Pomfret, DO Infectious Disease  Abundio Miu, MD [facility]    [facility]: [address], [place], [place] [address]. [phone]  Lorenz Coaster, DO Family Medicine  Donavan Burnet, NP Internal Medicine  Duffy Rhody, NP Internal Medicine    [facility] Internal Medicine: [address] [place], [place] [address]. [phone]  Don Perking, MD Internal Medicine  Craige Cotta, MD Internal Medicine    [facility]: [address], [place], [place] [address]. [phone]  Dareen Piano, MD Family Medicine  Judeth Cornfield, MD Family Medicine  Jamse Mead, MD Family Medicine  Lytle Michaels, NP Family Medicine  Guadlupe Spanish, ARPN, [facility] Family Medicine    [facility]: [address] [place], [place], [place], [place] [address]. [phone]  Meliton Rattan MD Internal Medicine  Golda Acre, MD Internal Medicine  Ilda Basset, MD Internal Medicine  Cordella Register, MD Internal Medicine  Tora Kindred, MD Internal Medicine  Su Grand, MD Internal Medicine  Yetta Barre, NP Internal Medicine    [facility]: [address], [place], [place] [address]. [phone]  Valda Lamb, MD Family Medicine  Lolita Patella, MD Family Medicine  Danley Danker, MD Family Medicine  Rosaria Ferries, [place] [facility]  Johnna Acosta, NP Family Medicine  Pieter Partridge, NP Family Medicine  Kristopher Oppenheim, NP [facility] Medicine    [facility]: [address], [place], [place] [address]. [phone]   Ronnette Juniper, MD Family Medicine   [facility]   Richardo Priest, MD Family Medicine   Marin Olp, NP Family Medicine    [facility]: [address], [place], [place] [address]. [phone]  Honor Junes, MD Family Medicine  Mathis Bud, MD Family Medicine  Basilia Jumbo, MD Family Medicine  Delena Serve. Su Hilt, MD Family Medicine  Bunnie Philips, DO Family Medicine  Shelbie Proctor, MD Family Medicine  Laurann Montana, MD Family Medicine  Rhett Bannister, DO Family Medicine    [facility] [place]: [address] [place], [place] [address]. [phone]  Elmo Putt, FNP Family Medicine  Ivar Bury, CNP Family Medicine    [facility]: [address] [place], [place] [address]. [phone]  Lawrence Santiago, MD   Tomasa Rand, [place] Family  Medicine     [facility]: [address], [place], [place] [address]. [phone]  Donzetta Matters, MD  Lady Gary, MD    [facility]: [address] [place], [place]  [address]. [phone]  Delma Officer,  MD    Independent Practice  Ellard Artis, MD: [address], [place], [place] [address]. [phone]  Mickie Bail, MD: [address] [place]. [phone]      Thank you for choosing our Emergency Department for your care.  It is our privilege to care for you in your time of need.  In the next several days, you may receive a survey via email or mailed to your home about your experience with our team.  We would greatly appreciate you taking a few minutes to complete the survey, as we use this information to learn what we have done well and what we could be doing better. Thank you for trusting [place] with your care!    Below you will find a list of your tests from today's visit.   Labs  Recent Results (from the past 12 hour(s))   Basic Metabolic Panel    Collection Time: 04/28/23 12:36 PM   Result Value Ref Range    Sodium 143 136 - 145 mmol/L    Potassium 4.6 3.5 - 5.1 mmol/L    Chloride 111 (H) 97 - 108 mmol/L    CO2 27 21 - 32 mmol/L    Anion Gap 5 5 - 15 mmol/L    Glucose 83 65 - 100 mg/dL    BUN 19 6 - 20 mg/dL    Creatinine 3.32 9.51 - 1.30 mg/dL    BUN/Creatinine Ratio 22 (H) 12 - 20      Est, Glom Filt Rate >90 >60 ml/min/1.25m2    Calcium 9.0 8.5 - 10.1 mg/dL   CBC with Auto Differential    Collection Time: 04/28/23 12:36 PM   Result Value Ref Range    WBC 4.9 4.1 - 11.1 K/uL    RBC 4.91 4.10 - 5.70 M/uL    Hemoglobin 13.1 12.1 - 17.0 g/dL    Hematocrit 88.4 16.6 - 50.3 %    MCV 82.1 80.0 - 99.0 FL    MCH 26.7 26.0 - 34.0 PG    MCHC 32.5 30.0 - 36.5 g/dL    RDW 06.3 (H) 01.6 - 14.5 %    Platelets 253 150 - 400 K/uL    MPV 10.4 8.9 - 12.9 FL    Nucleated RBCs 0.0 0.0 PER 100 WBC    nRBC 0.00 0.00 - 0.01 K/uL    Neutrophils % 47 32 - 75 %    Band Neutrophils 1 0 - 6 %    Lymphocytes % 43 12 - 49 %    Monocytes % 9 5 - 13 %    Eosinophils % 0 0 - 7 %    Basophils % 0 0 - 1 %    Immature Granulocytes % 0 %    Neutrophils Absolute 2.4 1.8 - 8.0 K/UL    Lymphocytes Absolute 2.1 0.8 -  3.5 K/UL    Monocytes Absolute 0.4 0.0 - 1.0 K/UL    Eosinophils Absolute 0.0 0.0 - 0.4 K/UL    Basophils Absolute 0.0 0.0 - 0.1 K/UL    Immature Granulocytes Absolute 0.0 K/UL    Differential Type Manual      RBC Comment Anisocytosis  1+  WBC Comment Atypical lymphs     Troponin    Collection Time: 04/28/23 12:36 PM   Result Value Ref Range    Troponin, High Sensitivity 6 0 - 76 ng/L   Troponin    Collection Time: 04/28/23  1:51 PM   Result Value Ref Range    Troponin, High Sensitivity 6 0 - 76 ng/L       Radiologic Studies  XR CHEST (2 VW)   Final Result   1. Heart size is normal. There is possibly mild pulmonary vascular congestion.      Electronically signed by Syliva Overman        ------------------------------------------------------------------------------------------------------------  The evaluation and treatment you received in the Emergency Department were for an urgent problem. It is important that you follow-up with a doctor, nurse practitioner, or physician assistant to:  (1) confirm your diagnosis,  (2) re-evaluation of changes in your illness and treatment, and (3) for ongoing care. Please take your discharge instructions with you when you go to your follow-up appointment.     If you have any problem arranging a follow-up appointment, contact us!  If your symptoms become worse or you do not improve as expected, please return to [place]. We are available 24 hours a day.     If a prescription has been provided, please fill it as soon as possible to prevent a delay in treatment. If you have any questions or reservations about taking the medication due to side effects or interactions with other medications, please call your primary care provider or contact us directly.  Again, THANK YOU for choosing [place] to care for YOU!

## 2023-04-28 NOTE — ED Provider Notes (Signed)
[facility] EMERGENCY DEPT  EMERGENCY DEPARTMENT HISTORY AND PHYSICAL EXAM      Date: 04/28/2023  Patient Name: Anthony Schultz  MRN: [id number]  Birthdate 05/01/1976  Date of evaluation: 04/28/2023  Provider: Jodie Echevaria, MD   Note Started: 12:53 PM EDT 04/28/23    HISTORY OF PRESENT ILLNESS     Chief Complaint   Patient presents with    Chest Pain       History Provided By: Patient    HPI: Anthony Schultz is a 47 y.o. male past medical history diabetes previously on metformin, seizures, Parkinson's disease, but no cardiac history presenting with several hours of chest pain.  Patient reports left-sided chest pain that comes and goes, is not exertional.  No radiation of the pain.  No worse with exertion.  Pain is worse with pressure.  Denies any trauma or falls.  Denies any overexertion or new workouts or muscle injury.  He denies any history of heart disease or heart attack in himself.  He does report history of smoking cigarettes.  Denies any shortness of breath with the symptoms.    He also reports he has been out of his medications for some time now and does not have a primary care doctor any longer.    PAST MEDICAL HISTORY   Past Medical History:  No past medical history on file.    Past Surgical History:  No past surgical history on file.    Family History:  No family history on file.    Social History:       Allergies:  No Known Allergies    PCP: None, None    Current Meds:   No current facility-administered medications for this encounter.     No current outpatient medications on file.       Social Determinants of Health:   Social Determinants of Health     Tobacco Use: Not on file   Alcohol Use: Not At Risk (04/28/2023)    AUDIT-C     Frequency of Alcohol Consumption: Never     Average Number of Drinks: Patient does not drink     Frequency of Binge Drinking: Never   Physicist, medical Strain: Not on file   Food Insecurity: Not on file   Transportation Needs: Not on file   Physical Activity: Not on file   Stress: Not on file    Social Connections: Not on file   Intimate Partner Violence: Not on file   Depression: Not on file   Housing Stability: Not on file   Interpersonal Safety: Not on file   Utilities: Not on file       PHYSICAL EXAM   Physical Exam  Vitals reviewed.   HENT:      Head: Normocephalic.      Nose: Nose normal.   Eyes:      Conjunctiva/sclera: Conjunctivae normal.   Cardiovascular:      Rate and Rhythm: Normal rate and regular rhythm.      Heart sounds: Normal heart sounds.   Pulmonary:      Effort: Pulmonary effort is normal.      Breath sounds: No wheezing.   Abdominal:      Palpations: There is no mass.      Tenderness: There is no abdominal tenderness. There is no rebound.   Musculoskeletal:         General: No deformity. Normal range of motion.      Cervical back: Normal range of motion and neck supple.  Skin:     General: Skin is warm and dry.   Neurological:      Mental Status: He is alert.   Psychiatric:         Mood and Affect: Mood normal.         Behavior: Behavior normal.           SCREENINGS                No data recorded    LAB, EKG AND DIAGNOSTIC RESULTS   Labs:  Recent Results (from the past 12 hour(s))   Basic Metabolic Panel    Collection Time: 04/28/23 12:36 PM   Result Value Ref Range    Sodium 143 136 - 145 mmol/L    Potassium 4.6 3.5 - 5.1 mmol/L    Chloride 111 (H) 97 - 108 mmol/L    CO2 27 21 - 32 mmol/L    Anion Gap 5 5 - 15 mmol/L    Glucose 83 65 - 100 mg/dL    BUN 19 6 - 20 mg/dL    Creatinine 0.98 1.19 - 1.30 mg/dL    BUN/Creatinine Ratio 22 (H) 12 - 20      Est, Glom Filt Rate >90 >60 ml/min/1.63m2    Calcium 9.0 8.5 - 10.1 mg/dL   CBC with Auto Differential    Collection Time: 04/28/23 12:36 PM   Result Value Ref Range    WBC 4.9 4.1 - 11.1 K/uL    RBC 4.91 4.10 - 5.70 M/uL    Hemoglobin 13.1 12.1 - 17.0 g/dL    Hematocrit 14.7 82.9 - 50.3 %    MCV 82.1 80.0 - 99.0 FL    MCH 26.7 26.0 - 34.0 PG    MCHC 32.5 30.0 - 36.5 g/dL    RDW 56.2 (H) 13.0 - 14.5 %    Platelets 253 150 - 400 K/uL     MPV 10.4 8.9 - 12.9 FL    Nucleated RBCs 0.0 0.0 PER 100 WBC    nRBC 0.00 0.00 - 0.01 K/uL    Neutrophils % PENDING %    Lymphocytes % PENDING %    Monocytes % PENDING %    Eosinophils % PENDING %    Basophils % PENDING %    Immature Granulocytes % PENDING %    Neutrophils Absolute PENDING K/UL    Lymphocytes Absolute PENDING K/UL    Monocytes Absolute PENDING K/UL    Eosinophils Absolute PENDING K/UL    Basophils Absolute PENDING K/UL    Immature Granulocytes Absolute PENDING K/UL    Differential Type PENDING    Troponin    Collection Time: 04/28/23 12:36 PM   Result Value Ref Range    Troponin, High Sensitivity 6 0 - 76 ng/L       EKG:.EKG interpreted by me.  Sinus bradycardia, heart rate 50, no STEMI    EKG 2: EKG at 1503 shows sinus bradycardia, heart rate 51, no STEMI    Telemetry interpretation: Interpreted by me, sinus bradycardia.  Intermixed with sinus rhythm normal rate    Radiologic Studies:  Non-plain film images such as CT, Ultrasound and MRI are read by the radiologist. Plain radiographic images are visualized and preliminarily interpreted by the ED Provider with the following findings: Xray Interpreted by me.  Shows no acute finding    Interpretation per the Radiologist below, if available at the time of this note:  XR CHEST (2 VW)   Final Result   1. Heart  size is normal. There is possibly mild pulmonary vascular congestion.      Electronically signed by Syliva Overman           ED COURSE and DIFFERENTIAL DIAGNOSIS/MDM   1:43 PM Differential and Considerations: Patient presents with Chest pain.  While the spectrum of DDx includes ACS, Aortic dissection, PNA, PE, PTX, pericarditis, myocarditis, GERD, costochondritis, anxiety, most concerned for muscle pain given the HPI, Physical exam and Heart Score.  The others are less likely.  Will obtain labs, CXR, EKG and depending on these results, disposition including admission is a consideration.     Composition of the HEART score for chest pain, angina,  NSTEMI in the ED.    HEART score criteria             Score  History: Highly suspicious    2    Moderately suspicious   1    Slightly or non-suspicious   0    ECG:  Significant ST depression   2    Nonspecific repolarisation disturbance 1    Normal      0    Age:  > or = 65 years    2    > 45 to < 65 years    1    < Or = to 45 years    0    Risk factors: > or = to 3 risk factors or history of CAD 2    1 or 2 risk factors    1    No risk factors known    0    Troponin: > or = to 3x normal limit   2    > 1 to < 3x normal limit   1    < or = to normal limit    0    Calculated Total : ___3___         0-3: 0.9-1.7% risk of adverse cardiac event. In the HEART Score, these patients were  discharged.   4-6: 12-16.6% risk of adverse cardiac event. In the HEART Score, these patients were  admitted to the hospital.   =7: 50-65% risk of adverse cardiac event. In the HEART Score, these patients were  candidates for early invasive measures.         Records Reviewed (source and summary of external notes): Prior medical records and Nursing notes.    Vitals:    Vitals:    04/28/23 1209 04/28/23 1230 04/28/23 1237   BP:  (!) 142/81    Pulse:  53    Resp:  20    Temp:   98.4 F (36.9 C)   TempSrc:   Oral   SpO2:  100%    Weight: 81.2 kg (179 lb)     Height: 1.626 m (5\' 4" )          ED COURSE     Patient feeling much better now.  Pain very minimal.  Resting comfortably on my exam.  Discussed follow-up with PCP, PCP referral.  Return with any worsening symptoms.  Patient understands and agrees with plan.    SEPSIS Reassessment: Sepsis reassessment not applicable    Clinical Management Tools:  Not Applicable    Patient was given the following medications:  Medications   ibuprofen (ADVIL;MOTRIN) tablet 400 mg (400 mg Oral Given 04/28/23 1242)       CONSULTS: See ED Course/MDM for further details.  None     Social Determinants affecting Diagnosis/Treatment: Patient lacks a PCP.  Given PCP/Free clinic resources.    Smoking Cessation: Smoking  Cessation Counseling  Discussed the risks of smoking tobacco products and the long term sequelae of tobacco use with the patient such as increased risk of heart attack, stroke, peripheral artery disease and cancer. The patient verbalized their understanding and were provided resources if asked. Counseled patient for approximately 3 minutes.  Patient is working on goals including decreased cigarette use per day and nicotine gum which she already has.    PROCEDURES   Unless otherwise noted above, none  Procedures      CRITICAL CARE TIME   Patient does not meet Critical Care Time, 0 minutes    ED IMPRESSION   No diagnosis found.      DISPOSITION/PLAN   DISPOSITION      Discharge Note: The patient is stable for discharge home. The signs, symptoms, diagnosis, and discharge instructions have been discussed, understanding conveyed, and agreed upon. The patient is to follow up as recommended or return to ER should their symptoms worsen.      PATIENT REFERRED TO:  No follow-up provider specified.      DISCHARGE MEDICATIONS:     Medication List      You have not been prescribed any medications.           DISCONTINUED MEDICATIONS:  There are no discharge medications for this patient.      I am the Primary Clinician of Record. Jodie Echevaria, MD (electronically signed)    (Please note that parts of this dictation were completed with voice recognition software. Quite often unanticipated grammatical, syntax, homophones, and other interpretive errors are inadvertently transcribed by the computer software. Please disregards these errors. Please excuse any errors that have escaped final proofreading.)      Jillian Warth, MD  04/28/23 1541

## 2023-04-29 LAB — EKG 12-LEAD
Atrial Rate: 50 {beats}/min
Atrial Rate: 51 {beats}/min
P Axis: 32 degrees
P Axis: 40 degrees
P-R Interval: 148 ms
P-R Interval: 162 ms
Q-T Interval: 468 ms
Q-T Interval: 476 ms
QRS Duration: 74 ms
QRS Duration: 74 ms
QTc Calculation (Bazett): 431 ms
QTc Calculation (Bazett): 433 ms
R Axis: -24 degrees
R Axis: -26 degrees
T Axis: -10 degrees
T Axis: 16 degrees
Ventricular Rate: 50 {beats}/min
Ventricular Rate: 51 {beats}/min

## 2023-10-24 ENCOUNTER — Encounter: Payer: Self-pay | Admitting: Podiatry

## 2023-10-24 ENCOUNTER — Ambulatory Visit (INDEPENDENT_AMBULATORY_CARE_PROVIDER_SITE_OTHER): Payer: Medicare Other | Admitting: Podiatry

## 2023-10-24 DIAGNOSIS — B353 Tinea pedis: Secondary | ICD-10-CM

## 2023-10-24 DIAGNOSIS — Z7689 Persons encountering health services in other specified circumstances: Secondary | ICD-10-CM

## 2023-10-24 DIAGNOSIS — L853 Xerosis cutis: Secondary | ICD-10-CM | POA: Diagnosis not present

## 2023-10-24 DIAGNOSIS — Z79899 Other long term (current) drug therapy: Secondary | ICD-10-CM | POA: Diagnosis not present

## 2023-10-24 DIAGNOSIS — B351 Tinea unguium: Secondary | ICD-10-CM

## 2023-10-24 MED ORDER — TRIAMCINOLONE ACETONIDE 0.1 % EX CREA: 1.0000 | TOPICAL_CREAM | Freq: Two times a day (BID) | CUTANEOUS | 0 refills | Status: AC

## 2023-10-24 NOTE — Patient Instructions (Signed)
You can use an antiperspirant spray on your feet.   Wear natural fiber socks such as wool socks, or moisture wicking socks to help with moisture. Changes socks daily, or more frequently if they feel wet.   You can soak your feet in warm water with a little vinegar to help.   --  Terbinafine Tablets What is this medication? TERBINAFINE (TER bin a feen) treats fungal infections of the nails. It belongs to a group of medications called antifungals. It will not treat infections caused by bacteria or viruses. This medicine may be used for other purposes; ask your health care provider or pharmacist if you have questions. COMMON BRAND NAME(S): Lamisil, Terbinex What should I tell my care team before I take this medication? They need to know if you have any of these conditions: Liver disease An unusual or allergic reaction to terbinafine, other medications, foods, dyes, or preservatives Pregnant or trying to get pregnant Breast-feeding How should I use this medication? Take this medication by mouth with water. Take it as directed on the prescription label at the same time every day. You can take it with or without food. If it upsets your stomach, take it with food. Keep taking it unless your care team tells you to stop. A special MedGuide will be given to you by the pharmacist with each prescription and refill. Be sure to read this information carefully each time. Talk to your care team regarding the use of this medication in children. Special care may be needed. Overdosage: If you think you have taken too much of this medicine contact a poison control center or emergency room at once. NOTE: This medicine is only for you. Do not share this medicine with others. What if I miss a dose? If you miss a dose, take it as soon as you can unless it is more than 4 hours late. If it is more than 4 hours late, skip the missed dose. Take the next dose at the normal time. What may interact with this  medication? Do not take this medication with any of the following: Pimozide Thioridazine This medication may also interact with the following: Beta blockers Caffeine Certain medications for mental health conditions Cimetidine Cyclosporine Medications for fungal infections like fluconazole and ketoconazole Medications for irregular heartbeat like amiodarone, flecainide and propafenone Rifampin Warfarin This list may not describe all possible interactions. Give your health care provider a list of all the medicines, herbs, non-prescription drugs, or dietary supplements you use. Also tell them if you smoke, drink alcohol, or use illegal drugs. Some items may interact with your medicine. What should I watch for while using this medication? Visit your care team for regular checks on your progress. You may need blood work while you are taking this medication. It may be some time before you see the benefit from this medication. This medication may cause serious skin reactions. They can happen weeks to months after starting the medication. Contact your care team right away if you notice fevers or flu-like symptoms with a rash. The rash may be red or purple and then turn into blisters or peeling of the skin. Or, you might notice a red rash with swelling of the face, lips or lymph nodes in your neck or under your arms. This medication can make you more sensitive to the sun. Keep out of the sun, If you cannot avoid being in the sun, wear protective clothing and sunscreen. Do not use sun lamps or tanning beds/booths. What side effects  may I notice from receiving this medication? Side effects that you should report to your care team as soon as possible: Allergic reactions--skin rash, itching, hives, swelling of the face, lips, tongue, or throat Change in sense of smell Change in taste Infection--fever, chills, cough, or sore throat Liver injury--right upper belly pain, loss of appetite, nausea,  light-colored stool, dark yellow or brown urine, yellowing skin or eyes, unusual weakness or fatigue Low red blood cell level--unusual weakness or fatigue, dizziness, headache, trouble breathing Lupus-like syndrome--joint pain, swelling, or stiffness, butterfly-shaped rash on the face, rashes that get worse in the sun, fever, unusual weakness or fatigue Rash, fever, and swollen lymph nodes Redness, blistering, peeling, or loosening of the skin, including inside the mouth Unusual bruising or bleeding Worsening mood, feelings of depression Side effects that usually do not require medical attention (report to your care team if they continue or are bothersome): Diarrhea Gas Headache Nausea Stomach pain Upset stomach This list may not describe all possible side effects. Call your doctor for medical advice about side effects. You may report side effects to FDA at 1-800-FDA-1088. Where should I keep my medication? Keep out of the reach of children and pets. Store between 20 and 25 degrees C (68 and 77 degrees F). Protect from light. Get rid of any unused medication after the expiration date. To get rid of medications that are no longer needed or have expired: Take the medication to a medication take-back program. Check with your pharmacy or law enforcement to find a location. If you cannot return the medication, check the label or package insert to see if the medication should be thrown out in the garbage or flushed down the toilet. If you are not sure, ask your care team. If it is safe to put it in the trash, take the medication out of the container. Mix the medication with cat litter, dirt, coffee grounds, or other unwanted substance. Seal the mixture in a bag or container. Put it in the trash. NOTE: This sheet is a summary. It may not cover all possible information. If you have questions about this medicine, talk to your doctor, pharmacist, or health care provider.  2024 Elsevier/Gold Standard  (2021-06-10 00:00:00)

## 2023-10-24 NOTE — Progress Notes (Unsigned)
Subjective:   Patient ID: William Walls, male   DOB: 47 y.o.   MRN: [id number]   HPI Chief Complaint  Patient presents with   Callouses    RM#14 Calluses / athletes foot both feet.   Dead skin, odor Feet do sweat a lot  On insulin for dm- does not check  Has not seen his PCP   ROS      Objective:  Physical Exam  ***     Assessment:  ***     Plan:  ***    -referra to pcp

## 2023-10-25 LAB — CBC WITH DIFFERENTIAL/PLATELET
Basophils Absolute: 0 10*3/uL (ref 0.0–0.2)
Basos: 1 %
EOS (ABSOLUTE): 0.1 10*3/uL (ref 0.0–0.4)
Eos: 2 %
Hematocrit: 41.1 % (ref 37.5–51.0)
Hemoglobin: 13.1 g/dL (ref 13.0–17.7)
Immature Grans (Abs): 0 10*3/uL (ref 0.0–0.1)
Immature Granulocytes: 0 %
Lymphocytes Absolute: 1.9 10*3/uL (ref 0.7–3.1)
Lymphs: 38 %
MCH: 28.4 pg (ref 26.6–33.0)
MCHC: 31.9 g/dL (ref 31.5–35.7)
MCV: 89 fL (ref 79–97)
Monocytes Absolute: 0.5 10*3/uL (ref 0.1–0.9)
Monocytes: 9 %
Neutrophils Absolute: 2.5 10*3/uL (ref 1.4–7.0)
Neutrophils: 50 %
Platelets: 244 10*3/uL (ref 150–450)
RBC: 4.62 x10E6/uL (ref 4.14–5.80)
RDW: 14.2 % (ref 11.6–15.4)
WBC: 5 10*3/uL (ref 3.4–10.8)

## 2023-10-25 LAB — HEPATIC FUNCTION PANEL
ALT: 25 [IU]/L (ref 0–44)
AST: 25 [IU]/L (ref 0–40)
Albumin: 4 g/dL — ABNORMAL LOW (ref 4.1–5.1)
Alkaline Phosphatase: 61 [IU]/L (ref 44–121)
Bilirubin Total: 0.2 mg/dL (ref 0.0–1.2)
Bilirubin, Direct: 0.1 mg/dL (ref 0.00–0.40)
Total Protein: 6.4 g/dL (ref 6.0–8.5)

## 2023-10-26 ENCOUNTER — Other Ambulatory Visit: Payer: Self-pay | Admitting: Podiatry

## 2023-10-26 DIAGNOSIS — Z79899 Other long term (current) drug therapy: Secondary | ICD-10-CM

## 2023-10-26 MED ORDER — TERBINAFINE HCL 250 MG PO TABS: 250.0000 mg | ORAL_TABLET | Freq: Every day | ORAL | 0 refills | Status: AC

## 2023-11-19 ENCOUNTER — Emergency Department (HOSPITAL_COMMUNITY)
Admission: EM | Admit: 2023-11-19 | Discharge: 2023-11-19 | Disposition: A | Discharge status: Home / Self Care | Payer: 59 | Attending: Emergency Medicine | Admitting: Emergency Medicine

## 2023-11-19 ENCOUNTER — Encounter (HOSPITAL_COMMUNITY): Payer: Self-pay | Admitting: *Deleted

## 2023-11-19 ENCOUNTER — Other Ambulatory Visit: Payer: Self-pay

## 2023-11-19 ENCOUNTER — Emergency Department (HOSPITAL_COMMUNITY): Payer: 59

## 2023-11-19 DIAGNOSIS — S63501A Unspecified sprain of right wrist, initial encounter: Secondary | ICD-10-CM | POA: Insufficient documentation

## 2023-11-19 DIAGNOSIS — X501XXA Overexertion from prolonged static or awkward postures, initial encounter: Secondary | ICD-10-CM | POA: Diagnosis not present

## 2023-11-19 DIAGNOSIS — M25531 Pain in right wrist: Secondary | ICD-10-CM | POA: Diagnosis present

## 2023-11-19 MED ORDER — ACETAMINOPHEN 325 MG PO TABS
650.0000 mg | ORAL_TABLET | Freq: Four times a day (QID) | ORAL | Status: DC | PRN
  Administered 2023-11-19: 650 mg via ORAL
  Filled 2023-11-19: qty 2

## 2023-11-19 NOTE — Discharge Instructions (Signed)
 Follow up with your Physician for recheck.  Tylenol for pain

## 2023-11-19 NOTE — ED Provider Notes (Signed)
 [place] [facility]GENCY DEPARTMENT AT [facility] Provider Note   CSN: [id number] Arrival date & time: 11/19/23  1010     History  Chief Complaint  Patient presents with   Wrist Pain    William Walls is a 48 y.o. male.  Pt reports he twisted his wrist yesterday turning  over a can.  The history is provided by the patient. No language interpreter was used.  Wrist Pain This is a new problem. The current episode started yesterday. The problem occurs constantly. The problem has been gradually worsening. Nothing aggravates the symptoms. Nothing relieves the symptoms. He has tried nothing for the symptoms. The treatment provided no relief.       Home Medications Prior to Admission medications   Medication Sig Start Date End Date Taking? Authorizing Provider  terbinafine  (LAMISIL ) 250 MG tablet Take 1 tablet (250 mg total) by mouth daily. 10/26/23   Gershon Donnice SAUNDERS, DPM  triamcinolone  cream (KENALOG ) 0.1 % Apply 1 Application topically 2 (two) times daily. 10/24/23   Gershon Donnice SAUNDERS, DPM      Allergies    Patient has no known allergies.    Review of Systems   Review of Systems  Musculoskeletal:  Positive for myalgias. Negative for joint swelling.  All other systems reviewed and are negative.   Physical Exam Updated Vital Signs BP 100/60   Pulse 66   Temp 98.2 F (36.8 C)   Resp 18   Ht 5' 4 (1.626 m)   Wt 81.6 kg   SpO2 95%   BMI 30.90 kg/m  Physical Exam Vitals and nursing note reviewed.  Constitutional:      Appearance: He is well-developed.  HENT:     Head: Normocephalic.  Cardiovascular:     Rate and Rhythm: Normal rate.  Pulmonary:     Effort: Pulmonary effort is normal.  Abdominal:     General: There is no distension.  Musculoskeletal:        General: Tenderness present. No swelling or deformity.  Skin:    General: Skin is warm.  Neurological:     General: No focal deficit present.     Mental Status: He is alert and oriented to  person, place, and time.     ED Results / Procedures / Treatments   Labs (all labs ordered are listed, but only abnormal results are displayed) Labs Reviewed - No data to display  EKG None  Radiology DG Wrist Complete Right Result Date: 11/19/2023 CLINICAL DATA:  Wrist pain without known injury. EXAM: RIGHT WRIST - COMPLETE 3+ VIEW COMPARISON:  None Available. FINDINGS: No evidence for an acute fracture. No subluxation or dislocation. Tiny lucency in the lunate may be a degenerative cyst or intraosseous ganglion, likely benign. No worrisome lytic or sclerotic osseous abnormality. IMPRESSION: 1. No acute bony findings. 2. Tiny lucency in the lunate may be a degenerative cyst or intraosseous ganglion, likely benign. Electronically Signed   By: Camellia Candle M.D.   On: 11/19/2023 10:59    Procedures Procedures    Medications Ordered in ED Medications  acetaminophen  (TYLENOL ) tablet 650 mg (has no administration in time range)    ED Course/ Medical Decision Making/ A&P                                 Medical Decision Making Pt reports he twisted his wrist yesterday.  Pt complains of pain with movement  Amount and/or  Complexity of Data Reviewed Radiology: ordered and independent interpretation performed. Decision-making details documented in ED Course.    Details: Xray  right wrist no fx, small cyst  Risk OTC drugs. Risk Details: Pt placed in an ace wrap,  tylenol  for pain.  Pt advised to follow up with primary care for recheck            Final Clinical Impression(s) / ED Diagnoses Final diagnoses:  Sprain of right wrist, initial encounter    Rx / DC Orders ED Discharge Orders     None      An After Visit Summary was printed and given to the patient.    Flint Sonny POUR, PA-C 11/19/23 1122    Emil Share, DO 11/19/23 1135

## 2023-11-19 NOTE — ED Triage Notes (Signed)
 C/o right wrist pain onset yest. Denies injury

## 2023-12-19 ENCOUNTER — Other Ambulatory Visit: Payer: Self-pay

## 2023-12-19 ENCOUNTER — Emergency Department (HOSPITAL_COMMUNITY)
Admission: EM | Admit: 2023-12-19 | Discharge: 2023-12-19 | Disposition: A | Discharge status: Home / Self Care | Payer: 59 | Attending: Emergency Medicine | Admitting: Emergency Medicine

## 2023-12-19 DIAGNOSIS — W458XXA Other foreign body or object entering through skin, initial encounter: Secondary | ICD-10-CM | POA: Diagnosis not present

## 2023-12-19 DIAGNOSIS — S60457A Superficial foreign body of left little finger, initial encounter: Secondary | ICD-10-CM | POA: Insufficient documentation

## 2023-12-19 DIAGNOSIS — S61207A Unspecified open wound of left little finger without damage to nail, initial encounter: Secondary | ICD-10-CM

## 2023-12-19 MED ORDER — LIDOCAINE HCL (PF) 1 % IJ SOLN
5.0000 mL | Freq: Once | INTRAMUSCULAR | Status: DC
  Filled 2023-12-19: qty 5

## 2023-12-19 NOTE — ED Triage Notes (Signed)
 Pt. Stated, I have a splinter in right medial hand

## 2023-12-19 NOTE — ED Provider Notes (Signed)
[place] EMERGENCY DEPARTMENT AT [facility] Provider Note   CSN: [id number] Arrival date & time: 12/19/23  1033     History  Chief Complaint  Patient presents with   splinter in finger    William Walls is a 48 y.o. male.  Patient complains of a splinter in his finger.  Patient states that he thinks he got a splinter in his hand yesterday from a tree limb.  Patient states he tried to remove it at home but his wife told him he should come to the hospital.  Patient denies any other area of injuries.  Patient complains of a splinter in the crease of his left fifth finger at the PIP.  The history is provided by the patient. No language interpreter was used.       Home Medications Prior to Admission medications   Medication Sig Start Date End Date Taking? Authorizing Provider  terbinafine (LAMISIL) 250 MG tablet Take 1 tablet (250 mg total) by mouth daily. 10/26/23   Vivi Barrack, DPM  triamcinolone cream (KENALOG) 0.1 % Apply 1 Application topically 2 (two) times daily. 10/24/23   Vivi Barrack, DPM      Allergies    Patient has no known allergies.    Review of Systems   Review of Systems  All other systems reviewed and are negative.   Physical Exam Updated Vital Signs BP 115/80   Pulse (!) 58   Temp 98.2 F (36.8 C)   Resp 16   SpO2 99%  Physical Exam Vitals reviewed.  Constitutional:      Appearance: Normal appearance.  Musculoskeletal:        General: Normal range of motion.     Comments: There is a scabbed area in the crease of the left fifth PIP on the lateral aspect.  Skin:    General: Skin is warm.  Neurological:     General: No focal deficit present.     Mental Status: He is alert. Mental status is at baseline.     ED Results / Procedures / Treatments   Labs (all labs ordered are listed, but only abnormal results are displayed) Labs Reviewed - No data to display  EKG None  Radiology No results  found.  Procedures Procedures    Medications Ordered in ED Medications  lidocaine (PF) (XYLOCAINE) 1 % injection 5 mL (has no administration in time range)    ED Course/ Medical Decision Making/ A&P                                 Medical Decision Making Risk Prescription drug management. Risk Details: No foreign body seen.  Pt counseled on wound care    I attempted to inject lidocaine into the area and he is unable to tolerate the injection.  I was able to unroofed the scabbed area with a tweezer and there does not appear to be a foreign body this appears to be just a skin callus.  I advised patient to soak the area and use topical antibiotic.  He is advised to return if any problems.        Final Clinical Impression(s) / ED Diagnoses Final diagnoses:  Open wound of left little finger    Rx / DC Orders ED Discharge Orders     None      An After Visit Summary was printed and given to the patient.    [place],  Lonia Skinner, PA-C 12/19/23 1246    Royanne Foots, DO 12/26/23 [phone]

## 2023-12-19 NOTE — Discharge Instructions (Signed)
 Soak area 20 minutes 4 times a day for the next 2 days.  Keep antibiotic ointment on the area for the next 2 days

## 2023-12-19 NOTE — ED Notes (Signed)
 Patient Alert and oriented to baseline. Stable and ambulatory to baseline. Patient verbalized understanding of the discharge instructions.  Patient belongings were taken by the patient.

## 2024-02-15 NOTE — ED Provider Notes (Signed)
 Department of Emergency Medicine  Tiny Forest, MD   02/15/2024      Patient Name: Anthony Schultz  MRN: [id number]    Notice to Healthcare Providers:   A significant portion of this documentation is for the purpose of billing and support of medical care provided during the ED encounter, which may not be relevant to your care of the patient. The most clinically-relevant information can be found in the RED boxes, along with a summary of the patient's ED course at the bottom of the note.       HISTORY OF PRESENT ILLNESS   Chief Complaint: Insect Bite    History Source: Chart Review/Review of Documentation from Prior Encounter(s), Patient, and Spouse/Significant Other    Travian Kerner is a(n) 48 y.o. male with PMH of no reported past medical history who presents to the ED for evaluation of wound on the posterior aspect of his left upper arm, which he states has been present for the past 2 days and is secondary to an insect bite.  No fevers.  On chart review, patient was seen on//25 at an outside hospital with same complaint as today and diagnosed with cellulitis of his left upper extremity.  He was prescribed doxycycline at that time, but he states that he did not fill it.    Review of systems negative except as noted above.     Medical History    Past Medical History  No past medical history on file.  Surgical History  No past surgical history on file.    Social History  Social History[1]   Medications  Current Outpatient Medications   Medication Instructions   . doxycycline (VIBRAMYCIN) 100 mg, Oral, 2 times daily   . metroNIDAZOLE 500 mg, Oral, 2 times daily     Home medications reviewed with changes, if any, as documented below     PHYSICAL EXAM    Ideal Weight: Patient weight not recorded   Temp: 97.7 F (36.5 C)   HR: (!) 54   RR: 16   BP: 110/76   Sats: 98 %     General: Alert, in no apparent distress.  Neuro: CN II-XII grossly intact. No focal neuro deficits. Moving all extremities spontaneously.  Eye: PERRLA. No  sclera icterus.  ENT: No nasal discharge. Throat clear.  Neck: Trachea midline. No JVD   Cardiovascular: Normal peripheral perfusion  Pulmonary: Respirations even and unlabored. Symmetric chest rise.  Abdomen: nondistended  Skin: Dry and acyanotic. No acute lesions, rashes, or ecchymosis, other than below  Musculoskeletal (focused): LUE     Pulse: 2+ radial     Tenderness: minimal TTP surrounding wound      ROM: intact in all joints     Deformities: no joint swelling.  1 cm subacute appearing, healing, superficial wound posterior upper arm.  Minimal surrounding erythema.  No palpable areas of fluctuance.  No warmth.     Sensation: Intact  Musculoskeletal (general): remaining extremities palpated without deformity or TTP. No limitation in ROM.     Medical Decision Making   No orders of the defined types were placed in this encounter.    Patient presents with local erythema, warmth, and swelling.  Ddx: Cellulitis, erysipelas, dermatitis, abscess, insect bite.  Sensitivity/pain to touch around the area.   Nontoxic appearing, hemodynamically stable, and without signs of sepsis.  No obvious lymphangitic spread visible, and no fluid pockets or fluctuance concerning for abscess.   No bullae, rapid progression concerning for necrotizing fasciitis.  No purulence.  Low concern for underlying osteomyelitis or DVT given history and exam.      External Notes Reviewed: ED record from outside hospital from 02/10/24, when he was seen for this wound  Tests Reviewed: Normal chemistry 06/02/2022  This patient has moderate risk due to diagnosis or treatment significantly limited by social determinants of health.    All ordered tests were independently reviewed and interpreted by myself. Labs ordered in this encounter were compared to prior laboratory values when available for changes from baseline values. Abnormal results from this encounter were reviewed and are below:  Labs Reviewed - No data to display   Imaging reviewed:  No orders to  display     Treatment and medications given this encounter include:  Medications   doxycycline (VIBRAMYCIN) capsule 100 mg (has no administration in time range)        EKG   N/A     Procedures   None indicated     Clinical Decision Tools        Critical Care Time   Not applicable     Consults   Patient was discussed with other providers: No  Consults: None     ED COURSE SUMMARY   Patient was seen and evaluated promptly on arrival to emergency department in room TRIAGE 1C by myself. History, physical, objective evaluation as above.  Informed that he actually needs to fill the prescription for doxycycline this time, though honestly the wound appears to be healing appropriately and there is only minimal erythema and tenderness.  He also needs to follow-up to establish care in the outpatient setting, so he was given the information for the free medical clinic.  He was given strict return precautions for development of any fever or worsening symptoms and is in agreement with plan for discharge at this time.     CLINICAL IMPRESSION   1. Cellulitis of left upper arm         Disposition   Discharged. I considered hospitalization for this patient, but given their reassuring ED evaluation, I have decided that the patient is stable for discharge.   Follow up: [facility]  [address]  [place], [place] [address]  [phone]    Walk-In: Monday-Friday  *First come, first serve. Best to arrive at 7:30AM or earlier.  Go to   call to establish care with a primary care physician in the area for follow up. Alternatively, if you have a primary care physician, call to schedule follow-up with them.    [facility] Emergency Department  [place] [facility]  [id number]  [phone]    As needed, If symptoms worsen, or for any new symptoms that are concerning to you.  Return for fever (>100 F)    I had a discussion with the patient regarding my clinical impression, my concerns and my  recommendations. I explained medical problems represent dynamic processes and my evaluation today represents a single point in time and that the problem may evolve. We discussed what to watch for, need for close monitoring, and need to return if symptoms do not improve or if they worsen.  Any questions were answered until diagnosis, concerns and plan for treatment were fully understood. I have given relevant prescriptions and problem-specific discharge instructions verbally and in their discharge paperwork.  Patient voiced understanding of the discharge instructions as well as strict return precautions.      Opioid Prescribing Disclosure:  N/A  Tiny Forest, MD  02/15/24 [phone]         [1]  Social History  Socioeconomic History   . Marital status: Single

## 2024-02-17 ENCOUNTER — Other Ambulatory Visit: Payer: Self-pay

## 2024-02-17 ENCOUNTER — Emergency Department (HOSPITAL_COMMUNITY)
Admission: EM | Admit: 2024-02-17 | Discharge: 2024-02-17 | Disposition: A | Discharge status: Home / Self Care | Attending: Emergency Medicine | Admitting: Emergency Medicine

## 2024-02-17 DIAGNOSIS — S40862A Insect bite (nonvenomous) of left upper arm, initial encounter: Secondary | ICD-10-CM | POA: Diagnosis not present

## 2024-02-17 DIAGNOSIS — S4992XA Unspecified injury of left shoulder and upper arm, initial encounter: Secondary | ICD-10-CM | POA: Diagnosis present

## 2024-02-17 DIAGNOSIS — W57XXXA Bitten or stung by nonvenomous insect and other nonvenomous arthropods, initial encounter: Secondary | ICD-10-CM | POA: Insufficient documentation

## 2024-02-17 DIAGNOSIS — T63304A Toxic effect of unspecified spider venom, undetermined, initial encounter: Secondary | ICD-10-CM

## 2024-02-17 MED ORDER — DOXYCYCLINE HYCLATE 100 MG PO TABS
100.0000 mg | ORAL_TABLET | Freq: Once | ORAL | Status: AC
  Administered 2024-02-17: 100 mg via ORAL
  Filled 2024-02-17: qty 1

## 2024-02-17 MED ORDER — DOXYCYCLINE HYCLATE 100 MG PO CAPS: 100.0000 mg | ORAL_CAPSULE | Freq: Two times a day (BID) | ORAL | 0 refills | Status: AC

## 2024-02-17 NOTE — Discharge Instructions (Signed)
 Take the prescribed medication as directed.  Keep clean with soap and warm water at least once daily. Follow-up with your doctor. Return to the ED for new or worsening symptoms.

## 2024-02-17 NOTE — ED Provider Notes (Signed)
 [place] EMERGENC[facility] DEPARTMENT AT [facility] Provider Note   CSN: [id number] Arrival date & time: 02/17/24  0505     History  Chief Complaint  Patient presents with   Spider Bite    William Walls is a 48 y.o. male.  The history is provided by the patient and medical records.   48 year old male presenting to the ED with spider bite to left posterior upper arm that occurred 2 days ago.  Reports he squeezed area yesterday and some white pus came out.  He became concerned for infection or that small spiders may be inside his arm.  No fever/chills.  No rashes.  Has been cleaning area with soap and warm water.  Home Medications Prior to Admission medications   Medication Sig Start Date End Date Taking? Authorizing Provider  doxycycline (VIBRAMYCIN) 100 MG capsule Take 1 capsule (100 mg total) by mouth 2 (two) times daily. 02/17/24  Yes Garlon Hatchet, PA-C  terbinafine (LAMISIL) 250 MG tablet Take 1 tablet (250 mg total) by mouth daily. 10/26/23   Vivi Barrack, DPM  triamcinolone cream (KENALOG) 0.1 % Apply 1 Application topically 2 (two) times daily. 10/24/23   Vivi Barrack, DPM      Allergies    Patient has no known allergies.    Review of Systems   Review of Systems  Skin:        Spider bite  All other systems reviewed and are negative.   Physical Exam Updated Vital Signs BP (!) 148/126 (BP Location: Right Arm)   Pulse (!) 52   Temp 97.7 F (36.5 C)   Resp 17   Physical Exam Vitals and nursing note reviewed.  Constitutional:      Appearance: He is well-developed.  HENT:     Head: Normocephalic and atraumatic.  Eyes:     Conjunctiva/sclera: Conjunctivae normal.     Pupils: Pupils are equal, round, and reactive to light.  Cardiovascular:     Rate and Rhythm: Normal rate and regular rhythm.     Heart sounds: Normal heart sounds.  Pulmonary:     Effort: Pulmonary effort is normal.     Breath sounds: Normal breath sounds.  Musculoskeletal:         General: Normal range of motion.     Cervical back: Normal range of motion.     Comments: Left posterior upper arm with area of bite noted, there is no active drainage/bleeding, no significant erythema but does appear to be some induration/firmness, no fluctuance appreciated, no FB palpable beneath skin  Skin:    General: Skin is warm and dry.  Neurological:     Mental Status: He is alert and oriented to person, place, and time.     ED Results / Procedures / Treatments   Labs (all labs ordered are listed, but only abnormal results are displayed) Labs Reviewed - No data to display  EKG None  Radiology No results found.  Procedures Procedures    Medications Ordered in ED Medications  doxycycline (VIBRA-TABS) tablet 100 mg (has no administration in time range)    ED Course/ Medical Decision Making/ A&P                                 Medical Decision Making Risk Prescription drug management.   48 year old male here with spider bite to left upper posterior arm.  Does have some induration on exam but there  is no fluctuance or overlying erythema.  He has no rash.  No areas of ulceration or skin necrosis.  Do not feel this needs I&D at this time.  He does report some purulent drainage so will start on course of doxycycline.  Encouraged to continue home wound care.  Can follow-up with PCP.  Return here for new concerns.  Final Clinical Impression(s) / ED Diagnoses Final diagnoses:  Spider bite wound, undetermined intent, initial encounter    Rx / DC Orders ED Discharge Orders          Ordered    doxycycline (VIBRAMYCIN) 100 MG capsule  2 times daily        02/17/24 0606              Garlon Hatchet, PA-C 02/17/24 0610    Melene Plan, DO 02/17/24 [phone]

## 2024-02-17 NOTE — ED Triage Notes (Signed)
 Patient reports spider bite at left upper posterior arm with itching onset last week .

## 2024-03-24 ENCOUNTER — Emergency Department: Admit: 2024-03-24 | Payer: MEDICAID

## 2024-03-24 ENCOUNTER — Emergency Department: Payer: MEDICAID

## 2024-03-24 ENCOUNTER — Inpatient Hospital Stay: Admission: EM | Admit: 2024-03-24 | Discharge: 2024-03-26 | Discharge status: Against Medical Advice | Payer: MEDICAID

## 2024-03-24 DIAGNOSIS — R55 Syncope and collapse: Secondary | ICD-10-CM

## 2024-03-24 DIAGNOSIS — R079 Chest pain, unspecified: Secondary | ICD-10-CM

## 2024-03-24 LAB — COMPREHENSIVE METABOLIC PANEL
ALT: 31 U/L (ref 12–78)
AST: 32 U/L (ref 15–37)
Albumin/Globulin Ratio: 1 — ABNORMAL LOW (ref 1.1–2.2)
Albumin: 3.6 g/dL (ref 3.5–5.0)
Alk Phosphatase: 65 U/L (ref 45–117)
Anion Gap: 4 mmol/L (ref 2–12)
BUN/Creatinine Ratio: 14 (ref 12–20)
BUN: 15 mg/dL (ref 6–20)
CO2: 25 mmol/L (ref 21–32)
Calcium: 8.7 mg/dL (ref 8.5–10.1)
Chloride: 111 mmol/L — ABNORMAL HIGH (ref 97–108)
Creatinine: 1.05 mg/dL (ref 0.70–1.30)
Est, Glom Filt Rate: 89 mL/min/{1.73_m2} (ref >60)
Globulin: 3.5 g/dL (ref 2.0–4.0)
Glucose: 75 mg/dL (ref 65–100)
Potassium: 5 mmol/L (ref 3.5–5.1)
Sodium: 140 mmol/L (ref 136–145)
Total Bilirubin: 0.4 mg/dL (ref 0.2–1.0)
Total Protein: 7.1 g/dL (ref 6.4–8.2)

## 2024-03-24 LAB — TROPONIN: Troponin, High Sensitivity: 6 ng/L (ref 0–76)

## 2024-03-24 MED ORDER — SODIUM CHLORIDE 0.9 % IV BOLUS
0.9 | Freq: Once | INTRAVENOUS | Status: AC
Start: 2024-03-24 — End: 2024-03-24
  Administered 2024-03-24: 23:00:00 1000 mL via INTRAVENOUS

## 2024-03-24 MED FILL — SODIUM CHLORIDE 0.9 % IV SOLN: 0.9 % | INTRAVENOUS | Qty: 1000 | Fill #0

## 2024-03-24 NOTE — H&P (Addendum)
 Hospitalist Admission Note    NAME: Anthony Schultz   DOB:  Jan 17, 1976   MRN:  [id number]     Date/Time:  03/24/2024 9:21 PM    Patient PCP: No, Pcp    ______________________________________________________________________  Given the patient's current clinical presentation, I have a high level of concern for decompensation if discharged from the emergency department.  Complex decision making was performed, which includes reviewing the patient's available past medical records, laboratory results, and x-ray films.       My assessment of this patient's clinical condition and my plan of care is as follows.    Assessment / Plan:    Syncope, could be cardiogenic  - Does not appear to be postictal even though patient has not been taking his Dilantin  for a year  -obtain orthostatics   -Place pt on tele to monitor for any arrhytmia   -EKG does not show any heart blocks, ST changes, is a normal sinus rhythm  -CT head appears to be normal, unlikely neurogenic  -Does not appear to have any medication causing orthostatic hypotension  -Drug screen negative   -ECHO to assess for valvular heart disease  -TSH/T4  -Replete electrolyte  -Consult cardiology for outpatient follow-up    Chest pain, resolved  - Troponin negative x 2, EKG does not have any ST changes  -Monitor for recurrence  - Will continue aspirin  -Chest x-ray shows pulmonary edema, however is on room air and no shortness of breath orthopnea or PND or swelling  - Never had a stress test   -Follow-up lipid panel HbA1c  - TIMI 0  - Will consult cardio for stress test inpatient versus outpatient    Hypomagnesemia  - Repleted IV  - Follow-up BMP    Seizure disorder  - Continue Dilantin   - Noncompliant, did not have any seizure recurrence, however 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 Problems (any 1)  [x]  Acute/Chronic Illness/injury posing threat to life or bodily function:    []  Severe exacerbation of chronic illness:    ---------------------------------------------------------------------  B. Risk of Treatment (any 1)   []  Drugs/treatments that require intensive monitoring for toxicity include:    []  IV ABX requiring serial renal monitoring for nephrotoxicity:     []  IV Narcotic analgesia for adverse drug reaction  []  Aggressive IV diuresis requiring serial monitoring for renal impairment and electrolyte derangements  []  Critical electrolyte abnormalities requiring IV replacement and close serial monitoring  []  Insulin - monitoring serial FSBS for Hypoglycemic adverse drug reaction  [x]  Other - trend troponin   []  Change in code status:    []  Decision to escalate care:    []  Major surgery/procedure with associated risk factors:    ----------------------------------------------------------------------  C. Data (any 2)  []  Discussed current management and discharge planning options with Case Management.  []  Discussed management of the case with:    []  Telemetry personally reviewed and interpreted as documented above    []  Imaging personally reviewed and interpreted, includes:    []  Data Review (any 3)  []  All available Consultant notes  from yesterday/today were reviewed  []  All current labs were reviewed and interpreted for clinical significance   []  Appropriate follow-up labs were ordered  []  Collateral history obtained from:      Discussed case with: ED provider. After discussion I am in agreement that acuity of patient's medical condition necessitates hospital stay.          Subjective:   CHIEF COMPLAINT: Chest pain and syncope     HISTORY OF PRESENT ILLNESS:     Anthony Schultz is a 48 y.o.  male with PMHx significant for Diabetes mellitus, seizure  disorder who came into the ED for chest pain for few hours.  Patient is driving from [place]  to [place] city drop of his cousin.  He then started having midsternal chest pain intermittent on and off probably getting worse which prompted him come to the ED for further evaluation.  Denies any shortness of breath, lightheadedness, PND, orthopnea or lower extremity swelling.  While in the ED, patient received medication he then syncopized while sitting on bed.  Is awake and alert x 4.  Did not have any urinary incontinence or any tongue biting.  Has not been taking his Dilantin , as he still discontinued as he did not have any seizures.  Never had a heart attack, or stroke.  Never had a stress test, does not have a cardiologist.    In the ED, patient's vital stable.  CT of the head unremarkable, drug screen, UA, chest x-ray shows mild pulmonary edema.  Troponin unremarkable D-dimer unremarkable.  EKG does not have any ST changes.  As patient syncopized in the ED, recommended observation for syncope.      We were asked to admit for work up and evaluation of the above problems.     Past Medical History:   Diagnosis Date    Blind one eye     Diabetes (HCC)     HTN (hypertension)     Parkinson's disease (HCC)     Seizure (HCC)         History reviewed. No pertinent surgical history.    Social History     Tobacco Use    Smoking status: Not on file    Smokeless tobacco: Not on file   Substance Use Topics    Alcohol use: Not on file        History reviewed. No pertinent family history.  No Known Allergies     Prior to Admission medications    Medication Sig Start Date End Date Taking? Authorizing Provider   ibuprofen  (ADVIL ;MOTRIN ) 600 MG tablet Take 1 tablet by mouth every 6 hours as needed for Pain 04/28/23   Hemminger, Adam, MD   metFORMIN  (GLUCOPHAGE -XR) 500 MG extended release tablet Take 1 tablet by mouth daily (with breakfast) 04/28/23   Hemminger, Adam, MD   phenytoin  (DILANTIN ) 100 MG ER capsule Take 1 capsule by  mouth 3 times daily 04/28/23   Hemminger, Adam, MD         Objective:   VITALS:    Patient Vitals for the past 24 hrs:   BP Temp Pulse Resp SpO2 Height Weight   03/24/24 2101 134/88 -- 59 21 100 % -- --   03/24/24 1931 123/80 -- 57 16 99 % -- --   03/24/24 1905 -- -- 62 15 100 % -- --   03/24/24 1900 128/75 -- 56 21 -- -- --   03/24/24 1650 -- -- -- -- -- 1.626 m (5\' 4" ) 86.2 kg (190  lb)   03/24/24 1648 121/80 98.8 F (37.1 C) 76 18 97 % -- --       Temp (24hrs), Avg:98.8 F (37.1 C), Min:98.8 F (37.1 C), Max:98.8 F (37.1 C)           Wt Readings from Last 12 Encounters:   03/24/24 86.2 kg (190 lb)   04/28/23 81.2 kg (179 lb)       Review of Systems   Constitutional: Negative.    HENT: Negative.     Respiratory:  Positive for chest tightness.    Cardiovascular:  Positive for chest pain.   Gastrointestinal: Negative.    Genitourinary: Negative.    Musculoskeletal: Negative.    Neurological:  Positive for syncope.        PHYSICAL EXAM:  Physical Exam    General: alert, awake, no acute distress.  HEENT: EOMI, no icterus, no pallor, pupils reactive, mucosa moist.   Neck: Neck is supple, No JVD.  Lungs: breathsounds normal, no respiratory distress on inspection.  CVS: S1, S2 heard, no murmurs, gallops, no JVD, no LE edema  GI: soft, nontender, normal BS.  Extremities: no clubbing, no cyanosis, no edema.  Neuro: Alert, awake, oriented x3, moving all extremities   Skin: normal skin turgor, no skin rashes or ulcerations.  Musculoskeletal: no acute joint swellings.              LAB DATA REVIEWED:    Recent Results (from the past 12 hours)   CBC with Auto Differential    Collection Time: 03/24/24  5:12 PM   Result Value Ref Range    WBC 6.5 4.1 - 11.1 K/uL    RBC 4.91 4.10 - 5.70 M/uL    Hemoglobin 14.0 12.1 - 17.0 g/dL    Hematocrit 09.8 11.9 - 50.3 %    MCV 84.1 80.0 - 99.0 FL    MCH 28.5 26.0 - 34.0 PG    MCHC 33.9 30.0 - 36.5 g/dL    RDW 14.7 (H) 82.9 - 14.5 %    Platelets 257 150 - 400 K/uL    MPV 10.8 8.9 - 12.9 FL     Nucleated RBCs 0.0 0.0 PER 100 WBC    nRBC 0.00 0.00 - 0.01 K/uL    Neutrophils % 52.0 32 - 75 %    Lymphocytes % 43.0 12 - 49 %    Monocytes % 5.0 5 - 13 %    Eosinophils % 0.0 0 - 7 %    Basophils % 0.0 0 - 1 %    Immature Granulocytes % 0.0 %    Neutrophils Absolute 3.37 1.8 - 8.0 K/UL    Lymphocytes Absolute 2.80 0.8 - 3.5 K/UL    Monocytes Absolute 0.33 0.0 - 1.0 K/UL    Eosinophils Absolute 0.00 0.0 - 0.4 K/UL    Basophils Absolute 0.00 0.0 - 0.1 K/UL    Immature Granulocytes Absolute 0.00 K/UL    Differential Type Manual      RBC Comment Normocytic, Normochromic      WBC Comment ATYPICAL LYMPHS PRESENT    Troponin    Collection Time: 03/24/24  5:12 PM   Result Value Ref Range    Troponin, High Sensitivity 6 0 - 76 ng/L   Comprehensive Metabolic Panel    Collection Time: 03/24/24  5:12 PM   Result Value Ref Range    Sodium 140 136 - 145 mmol/L    Potassium 5.0 3.5 - 5.1 mmol/L    Chloride 111 (H)  97 - 108 mmol/L    CO2 25 21 - 32 mmol/L    Anion Gap 4 2 - 12 mmol/L    Glucose 75 65 - 100 mg/dL    BUN 15 6 - 20 mg/dL    Creatinine 1.61 0.96 - 1.30 mg/dL    BUN/Creatinine Ratio 14 12 - 20      Est, Glom Filt Rate 89 >60 ml/min/1.24m2    Calcium 8.7 8.5 - 10.1 mg/dL    Total Bilirubin 0.4 0.2 - 1.0 mg/dL    AST 32 15 - 37 U/L    ALT 31 12 - 78 U/L    Alk Phosphatase 65 45 - 117 U/L    Total Protein 7.1 6.4 - 8.2 g/dL    Albumin 3.6 3.5 - 5.0 g/dL    Globulin 3.5 2.0 - 4.0 g/dL    Albumin/Globulin Ratio 1.0 (L) 1.1 - 2.2     D-Dimer, Quantitative    Collection Time: 03/24/24  7:18 PM   Result Value Ref Range    D-Dimer, Quant 0.40 <0.50 ug/ml(FEU)   Troponin    Collection Time: 03/24/24  7:18 PM   Result Value Ref Range    Troponin, High Sensitivity 5 0 - 76 ng/L   Brain Natriuretic Peptide    Collection Time: 03/24/24  7:18 PM   Result Value Ref Range    NT Pro-BNP <5 <125 pg/mL   Magnesium     Collection Time: 03/24/24  7:18 PM   Result Value Ref Range    Magnesium  1.4 (L) 1.6 - 2.4 mg/dL   ETOH    Collection  Time: 03/24/24  7:18 PM   Result Value Ref Range    Ethanol Lvl <10 <10 mg/dL   Urine Drug Screen    Collection Time: 03/24/24  7:18 PM   Result Value Ref Range    Amphetamine, Urine Negative Negative      Barbiturates, Urine Negative Negative      Benzodiazepines, Urine Negative Negative      Cocaine, Urine Negative Negative      Methadone, Urine Negative Negative      Opiates, Urine Negative Negative      Phencyclidine, Urine Negative Negative      THC, TH-Cannabinol, Urine Positive (A) Negative      Comments:        This test is a screen for drugs of abuse in a medical setting only (i.e., they are unconfirmed results and as such must not be used for non-medical purposes, e.g.,employment testing, legal testing). Due to its inherent nature, false positive (FP) and false negative (FN) results may be obtained. Therefore, if necessary for medical care, recommend confirmation of positive findings by GC/MS.     Urinalysis with Reflex to Culture    Collection Time: 03/24/24  7:18 PM    Specimen: Urine   Result Value Ref Range    Color, UA Yellow      Appearance Clear Clear      Specific Gravity, UA 1.015 1.003 - 1.030      pH, Urine 7.0 5.0 - 8.0      Protein, UA Trace (A) Negative mg/dL    Glucose, Ur Negative Negative mg/dL    Ketones, Urine Negative Negative mg/dL    Bilirubin, Urine Negative Negative      Blood, Urine Negative Negative      Urobilinogen, Urine 0.1 0.1 - 1.0 EU/dL    Nitrite, Urine Negative Negative      Leukocyte Esterase, Urine  Negative Negative      WBC, UA 0-4 0 - 4 /hpf    RBC, UA 0-5 0 - 5 /hpf    Epithelial Cells, UA Few Few /lpf    BACTERIA, URINE Negative Negative /hpf    Urine Culture if Indicated Culture not indicated by UA result Culture not indicated by UA result      Mucus, UA 1+ (A) Negative /lpf         CT Result (most recent):  CT HEAD WO CONTRAST 03/24/2024    Narrative  EXAM:  CT HEAD WO CONTRAST    INDICATION:   syncope    COMPARISON: CT head 01/28/2013.    TECHNIQUE:  Unenhanced CT  of the head was performed using 5 mm images. Brain and bone  windows were generated.  CT dose reduction was achieved through use of a  standardized protocol tailored for this examination and automatic exposure  control for dose modulation.    FINDINGS:  The ventricles are normal in size and position. Basilar cisterns are patent. No  midline shift. There is no evidence of acute infarct, hemorrhage, or extraaxial  fluid collection.    The paranasal sinuses, mastoid air cells, and middle ears are clear. The orbital  contents are within normal limits.  There are no significant osseous or  extracranial soft tissue lesions.    Impression  1. No evidence of acute intracranial abnormality.      Electronically signed by Hamp Levine Result (most recent):  XR CHEST 1 VIEW 03/24/2024    Narrative  EXAM:  XR CHEST 1 VIEW    INDICATION:   Chest Pain    COMPARISON: None.    FINDINGS: AP radiograph of the chest was obtained.    Mild diffuse bilateral interstitial opacities. No pleural effusion or  pneumothorax. Mild cardiomegaly. No acute osseous abnormalities.    Impression  1. Mild pulmonary interstitial edema.      Electronically signed by Marisue Side        _______________________________________________________________________    TOTAL TIME:  70 Minutes    Critical Care Provided     Minutes non procedure based    Signed: Sandralee Crow, MD    Procedures: see electronic medical records for all procedures/Xrays and details which were not copied into this note but were reviewed prior to creation of Plan.

## 2024-03-24 NOTE — ED Provider Notes (Signed)
 [facility]  EMERGENCY DEPARTMENT HISTORY AND PHYSICAL EXAM      Date of evaluation: 03/24/2024  Patient Name: Anthony Schultz  Birthdate [birth year]  MRN: [id number]  ED Provider: Madolyn Schlatter, MD   Note Started: 7:40 PM EDT 03/24/24    HISTORY OF PRESENT ILLNESS     Chief Complaint   Patient presents with    Chest Pain       History Provided By: Patient, only     HPI: Anthony Schultz is a 48 y.o. male patient with a history of diabetes hypertension presents with chest pain.  Chest pressure substernal without radiation.    PAST MEDICAL HISTORY   Past Medical History:  Past Medical History:   Diagnosis Date    Blind one eye     Diabetes (HCC)     HTN (hypertension)     Parkinson's disease (HCC)     Seizure (HCC)        Past Surgical History:  History reviewed. No pertinent surgical history.    Family History:  History reviewed. No pertinent family history.    Social History:       Allergies:  No Known Allergies    PCP: No, Pcp    Current Meds:   Current Facility-Administered Medications   Medication Dose Route Frequency Provider Last Rate Last Admin    sodium chloride 0.9 % bolus 1,000 mL  1,000 mL IntraVENous Once Shila Kruczek M, MD 1,935.5 mL/hr at 03/24/24 1924 1,000 mL at 03/24/24 1924     Current Outpatient Medications   Medication Sig Dispense Refill    ibuprofen  (ADVIL ;MOTRIN ) 600 MG tablet Take 1 tablet by mouth every 6 hours as needed for Pain 30 tablet 0    metFORMIN  (GLUCOPHAGE -XR) 500 MG extended release tablet Take 1 tablet by mouth daily (with breakfast) 30 tablet 1    phenytoin  (DILANTIN ) 100 MG ER capsule Take 1 capsule by mouth 3 times daily 90 capsule 1       Social Determinants of Health:   Social Drivers of Health     Tobacco Use: High Risk (11/19/2023)    Received from [facility]    Patient History     Smoking Tobacco Use: Every Day     Smokeless Tobacco Use: Never     Passive Exposure: Not on file   Alcohol Use: Patient Declined (03/24/2024)    AUDIT-C     Frequency of Alcohol Consumption:  Patient declined     Average Number of Drinks: Patient declined     Frequency of Binge Drinking: Patient declined   Physicist, medical Strain: Not on file   Food Insecurity: Not on file   Transportation Needs: Not on file   Physical Activity: Not on file   Stress: Not on file   Social Connections: Unknown (03/10/2023)    Received from [facility], [facility]    Social Connections     Frequency of Communication with Friends and Family: Not asked     Frequency of Social Gatherings with Friends and Family: Not asked   Intimate Partner Violence: Unknown (03/10/2023)    Received from [facility], [facility]    Intimate Partner Violence     Fear of Current or Ex-Partner: Not asked     Emotionally Abused: Not asked     Physically Abused: Not asked     Sexually Abused: Not asked   Depression: Not at risk (11/24/2021)    Received from [facility], [facility]  PHQ-2     PHQ-2 Score: 0   Housing Stability: Not on file   Interpersonal Safety: Not At Risk (03/24/2024)    Interpersonal Safety Domain Source: IP Abuse Screening     Physical abuse: Denies     Verbal abuse: Denies     Emotional abuse: Denies     Financial abuse: Denies     Sexual abuse: Denies   Utilities: Not on file     PHYSICAL EXAM   Physical Exam  Constitutional:       General: He is in acute distress.      Appearance: Normal appearance. He is ill-appearing and diaphoretic.   HENT:      Head: Normocephalic and atraumatic.      Right Ear: External ear normal.      Left Ear: External ear normal.      Nose: Nose normal. No congestion or rhinorrhea.      Mouth/Throat:      Mouth: Mucous membranes are moist.   Eyes:      Extraocular Movements: Extraocular movements intact.      Conjunctiva/sclera: Conjunctivae normal.      Pupils: Pupils are equal, round, and reactive to light.   Cardiovascular:      Rate and Rhythm: Normal rate and regular rhythm.      Pulses: Normal pulses.      Heart sounds: Normal heart sounds.   Pulmonary:      Effort:  Pulmonary effort is normal. No respiratory distress.      Breath sounds: Normal breath sounds.   Abdominal:      General: Abdomen is flat. Bowel sounds are normal. There is no distension.      Palpations: Abdomen is soft.      Tenderness: There is no abdominal tenderness.   Musculoskeletal:         General: No swelling, tenderness, deformity or signs of injury. Normal range of motion.      Cervical back: Normal range of motion and neck supple. No rigidity.   Skin:     General: Skin is warm.      Capillary Refill: Capillary refill takes less than 2 seconds.      Coloration: Skin is pale. Skin is not jaundiced.      Findings: No rash.   Neurological:      General: No focal deficit present.      Mental Status: He is alert. Mental status is at baseline. He is disoriented.   Psychiatric:         Mood and Affect: Mood normal.         Behavior: Behavior normal.         Thought Content: Thought content normal.         Judgment: Judgment normal.         SCREENINGS                No data recorded       LAB, EKG AND DIAGNOSTIC RESULTS   Labs:  Recent Results (from the past 12 hours)   CBC with Auto Differential    Collection Time: 03/24/24  5:12 PM   Result Value Ref Range    WBC 6.5 4.1 - 11.1 K/uL    RBC 4.91 4.10 - 5.70 M/uL    Hemoglobin 14.0 12.1 - 17.0 g/dL    Hematocrit 16.1 09.6 - 50.3 %    MCV 84.1 80.0 - 99.0 FL    MCH 28.5 26.0 - 34.0 PG  MCHC 33.9 30.0 - 36.5 g/dL    RDW 16.1 (H) 09.6 - 14.5 %    Platelets 257 150 - 400 K/uL    MPV 10.8 8.9 - 12.9 FL    Nucleated RBCs 0.0 0.0 PER 100 WBC    nRBC 0.00 0.00 - 0.01 K/uL    Neutrophils % 52.0 32 - 75 %    Lymphocytes % 43.0 12 - 49 %    Monocytes % 5.0 5 - 13 %    Eosinophils % 0.0 0 - 7 %    Basophils % 0.0 0 - 1 %    Immature Granulocytes % 0.0 %    Neutrophils Absolute 3.37 1.8 - 8.0 K/UL    Lymphocytes Absolute 2.80 0.8 - 3.5 K/UL    Monocytes Absolute 0.33 0.0 - 1.0 K/UL    Eosinophils Absolute 0.00 0.0 - 0.4 K/UL    Basophils Absolute 0.00 0.0 - 0.1 K/UL     Immature Granulocytes Absolute 0.00 K/UL    Differential Type Manual      RBC Comment Normocytic, Normochromic      WBC Comment ATYPICAL LYMPHS PRESENT    Troponin    Collection Time: 03/24/24  5:12 PM   Result Value Ref Range    Troponin, High Sensitivity 6 0 - 76 ng/L   Comprehensive Metabolic Panel    Collection Time: 03/24/24  5:12 PM   Result Value Ref Range    Sodium 140 136 - 145 mmol/L    Potassium 5.0 3.5 - 5.1 mmol/L    Chloride 111 (H) 97 - 108 mmol/L    CO2 25 21 - 32 mmol/L    Anion Gap 4 2 - 12 mmol/L    Glucose 75 65 - 100 mg/dL    BUN 15 6 - 20 mg/dL    Creatinine 0.45 4.09 - 1.30 mg/dL    BUN/Creatinine Ratio 14 12 - 20      Est, Glom Filt Rate 89 >60 ml/min/1.80m2    Calcium 8.7 8.5 - 10.1 mg/dL    Total Bilirubin 0.4 0.2 - 1.0 mg/dL    AST 32 15 - 37 U/L    ALT 31 12 - 78 U/L    Alk Phosphatase 65 45 - 117 U/L    Total Protein 7.1 6.4 - 8.2 g/dL    Albumin 3.6 3.5 - 5.0 g/dL    Globulin 3.5 2.0 - 4.0 g/dL    Albumin/Globulin Ratio 1.0 (L) 1.1 - 2.2         EKG:.See ED Course Below    Radiologic Studies:  Radiographic images are visualized and preliminarily interpreted by the ED Provider with the following findings: See ED Course Below.     Interpretation per the Radiologist below, if available at the time of this note:  CT Head W/O Contrast   Final Result   1. No evidence of acute intracranial abnormality.         Electronically signed by Marisue Side      XR CHEST 1 VIEW   Final Result   1. Mild pulmonary interstitial edema.         Electronically signed by Marisue Side           Records Reviewed: See ED Course    MEDICAL DECISION MAKING and ED COURSE   7:40 PM Differential and Considerations of tests not ordered: Patient presented with chest pain chest pressure plan to rule out ACS versus STEMI versus NSTEMI versus musculoskeletal versus GERD  versus PE.    While waiting patient had a syncopal event.  No trauma noted.  Became very diaphoretic clammy pale and passed out.  Will also work  patient up as a [employer] syncope workup    Patient was assessed for risk of pulmonary emboli utilizing Wells criteria:  Clinical signs of DVT: NO  Pulmonary emboli as #1 diagnosis: NO  Heart rate greater than 100: NO  Immobilization of at least 3 days for surgery within the last 4 weeks: NO  Prior history of PE or DVT: NO  Hemoptysis: NO  Malignancy history: NO    Patient is a low risk according to Wells criteria with a 1.3 chance of pulmonary emboli in the studied ED population.  Also pulmonary emboli is unlikely based on clinical studies.    Patient is a good candidate to utilize d-dimer as a screening tool.  Negative d-dimer along with Wells criteria suggests pulmonary emboli as an unlikely diagnosis      HEART SCORE:     History (slight suspicious 0, moderate +1, highly suspicious +2)  1  EKG (normal 0, nonspecific repol changes +1, ST changes +2)  0  Age (<45 y 0, 45-64y +1, >65y +2)  1  Risk Factors HTN, XOL, DM, obesity, smoker, CAD, PAD (none 0, 1-2 factors +1, >3 +2 factors) 1  Troponin (normal 0, 1-3x limit +1, >3x limit +2)  0    Heart Score 3        Management   Scores 0-3: 0.9-1.7% risk of adverse cardiac event. In the HEART Score study, these patients were discharged (0.99% in the retrospective study, 1.7% in the prospective study)   Scores 4-6: 12-16.6% risk of adverse cardiac event. In the HEART Score study, these patients were admitted to the hospital. (11.6% retrospective, 16.6% prospective)   Scores >=7: 50-65% risk of adverse cardiac event. In the HEART Score study, these patients were candidates for early invasive measures. (65.2% retrospective, 50.1% prospective)   A MACE (Major Adverse Cardiac Event) was defined as all-cause mortality, myocardial infarction, or coronary revascularization.    Original/Primary Reference  Six AJ, Backus BE, Kelder JC. Chest pain in the emergency room: value of the HEART score. Neth Heart J. 2008;16(6):191-6.   Validation  Backus BE, Six AJ, Kelder JC, et al.  A prospective validation of the HEART score for chest pain patients at the emergency department. Int J Cardiol. 2013;168(3):2153-8.   Backus BE, Six AJ, Roxan Copes, et al. Chest pain in the emergency room: a multicenter validation of the HEART Score. Crit Pathw Cardiol. 2010;9(3):164-9.  Stopyra JP, Riley RF, Hiestand BC, et al. The HEART Pathway Randomized Controlled Trial One Year Outcomes. Acad Emerg Med. 2018;          Vitals:    Vitals:    03/24/24 1648 03/24/24 1650 03/24/24 1900 03/24/24 1905   BP: 121/80  128/75    Pulse: 76  56 62   Resp: 18  21 15    Temp: 98.8 F (37.1 C)      SpO2: 97%   100%   Weight:  86.2 kg (190 lb)     Height:  1.626 m (5\' 4" )         ED COURSE  ED Course as of 03/25/24 1424   Sat Mar 24, 2024   1657 EKG at 1645 normal sinus rhythm rate of 66.  No ST changes.  T wave inversion present 3 aVF V5 V6 reason not ACS.  Interpreted independently by  ER physician. [HP]   1849 CBC with Auto Differential(!):    WBC 6.5   RBC 4.91   Hemoglobin Quant 14.0   Hematocrit 41.3   MCV 84.1   MCH 28.5   MCHC 33.9   RDW 14.9(!)   Platelet Count 257   MPV 10.8   Nucleated Red Blood Cells 0.0   Nucleated Red Blood Cells 0.00   Neutrophils % 52.0   Lymphocyte % 43.0   Monocytes % 5.0   Eosinophils % 0.0   Basophils % 0.0   Immature Granulocytes % 0.0   Neutrophils Absolute 3.37   Lymphocytes Absolute 2.80   Monocytes Absolute 0.33   Eosinophils Absolute 0.00   Basophils Absolute 0.00   Immature Granulocytes Absolute 0.00   Differential Type Manual   RBC Comment Normocytic, Normochromic   WBC Comment ATYPICAL LYMPHS PRESENT  Lab work interpreted independently by ER physician as NORMAL   [HP]   1849 Troponin, High Sensitivity: 6  Lab work interpreted independently by ER physician as NORMAL   [HP]   1849 Comprehensive Metabolic Panel(!):    Sodium 140   Potassium 5.0   Chloride 111(!)   CARBON DIOXIDE 25   Anion Gap 4   Glucose 75   BUN,BUNPL 15   Creatinine 1.05   Bun/Cre 14   Est, Glom Filt Rate 89   Calcium  8.7   Total Bilirubin 0.4   AST 32   ALT 31   Alkaline Phosphatase 65   Total Protein 7.1   Albumin 3.6   Globulin 3.5   Albumin/Globulin Ratio 1.0(!)  Lab work interpreted independently by ER physician as NORMAL   [HP]   1956 CT Head W/O Contrast  FINDINGS:  The ventricles are normal in size and position. Basilar cisterns are patent. No  midline shift. There is no evidence of acute infarct, hemorrhage, or extraaxial  fluid collection.     The paranasal sinuses, mastoid air cells, and middle ears are clear. The orbital  contents are within normal limits.  There are no significant osseous or  extracranial soft tissue lesions.     IMPRESSION:  1. No evidence of acute intracranial abnormality.        Electronically signed by Marisue Side   [HP]   1956 XR CHEST 1 VIEW  FINDINGS: AP radiograph of the chest was obtained.     Mild diffuse bilateral interstitial opacities. No pleural effusion or  pneumothorax. Mild cardiomegaly. No acute osseous abnormalities.     IMPRESSION:  1. Mild pulmonary interstitial edema.        Electronically signed by Marisue Side   [HP]   [phone] Patient with a witnessed syncopal event with hypotension. [HP]   2056 THC, TH-Cannabinol, Urine(!): Positive  positive [HP]   2056 D-Dimer, Quant: 0.40  Lab work interpreted independently by ER physician as NORMAL   [HP]   2112 Discussed the case with Dr. Starlin Echevaria.  Plan for admission.  Patient does meet criteria for admission according to [facility] syncope rules with his syncope and hypotension. [HP]      ED Course User Index  [HP] Madolyn Schlatter, MD       Clinical Management Tools:  Not Applicable    Smoking Cessation: Not Applicable    Patient was given the following medications:  Medications   sodium chloride 0.9 % bolus 1,000 mL (1,000 mLs IntraVENous New Bag 03/24/24 1924)       CONSULTS: See ED Course/MDM for further details.  None   PROCEDURES   Unless otherwise noted above, none  Procedures    SEPSIS REASSESSMENT & CRITICAL CARE TIME    SEPSIS REASSESSMENT: Patient does NOT meet Sepsis criteria after ED workup    Critical Care Time: There was a high probability of life-threatening clinical deterioration in the patient's condition requiring my urgent intervention.  I personally saw the patient and independently provided 30 minutes of non-concurrent critical care out of the total shared critical care time provided, excluding separately reportable procedures.   CLINICAL IMPRESSIONS     1. Chest pain, unspecified type    2. Syncope and collapse       SDOH/DISPOSITION/PLAN   Social Determinants affecting Treatment Plan: None    DISPOSITION               Admit Note: Pt is being admitted by Hospitalist . The results of their tests and reason(s) for their admission have been discussed with pt and/or available family. They convey agreement and understanding for the need to be admitted and for the admission diagnosis.     PATIENT REFERRED TO:  No follow-up provider specified.      DISCHARGE MEDICATIONS:     Medication List        ASK your doctor about these medications      ibuprofen  600 MG tablet  Commonly known as: ADVIL ;MOTRIN   Take 1 tablet by mouth every 6 hours as needed for Pain     metFORMIN  500 MG extended release tablet  Commonly known as: GLUCOPHAGE -XR  Take 1 tablet by mouth daily (with breakfast)     phenytoin  100 MG ER capsule  Commonly known as: Dilantin   Take 1 capsule by mouth 3 times daily                DISCONTINUED MEDICATIONS:  Current Discharge Medication List          I am the Primary Clinician of Record. Madolyn Schlatter, MD (electronically signed)    (Please note that parts of this dictation were completed with voice recognition software. Quite often unanticipated grammatical, syntax, homophones, and other interpretive errors are inadvertently transcribed by the computer software. Please disregards these errors. Please excuse any errors that have escaped final proofreading.)        Madolyn Schlatter, MD  03/25/24 1426

## 2024-03-24 NOTE — ED Notes (Signed)
 ED TO INPATIENT SBAR HANDOFF    Patient Name: Anthony Schultz   Preferred Name: Anthony Schultz  DOB: 11/27/1975  48 y.o.   Family/Caregiver Present: no   Code Status Order: Full Code  PO Status: NPO:No  Telemetry Order: Yes  C-SSRS: Risk of Suicide: No Risk  Sitter no   Restraints:     Sepsis Risk Score Sepsis V2 Risk Score: 2.3    Situation  Chief Complaint   Patient presents with    Chest Pain     Brief Description of Patient's Condition: pt with cp   Mental Status: oriented, alert, coherent, logical, thought processes intact, and able to concentrate and follow conversation  Arrived from:Home  Imaging:   CT Head W/O Contrast   Final Result   1. No evidence of acute intracranial abnormality.         Electronically signed by Marisue Side      XR CHEST 1 VIEW   Final Result   1. Mild pulmonary interstitial edema.         Electronically signed by Marisue Side        Abnormal labs:   Abnormal Labs Reviewed   CBC WITH AUTO DIFFERENTIAL - Abnormal; Notable for the following components:       Result Value    RDW 14.9 (*)     All other components within normal limits   COMPREHENSIVE METABOLIC PANEL - Abnormal; Notable for the following components:    Chloride 111 (*)     Albumin/Globulin Ratio 1.0 (*)     All other components within normal limits   MAGNESIUM - Abnormal; Notable for the following components:    Magnesium 1.4 (*)     All other components within normal limits   URINE DRUG SCREEN - Abnormal; Notable for the following components:    THC, TH-Cannabinol, Urine Positive (*)     All other components within normal limits   URINALYSIS WITH REFLEX TO CULTURE - Abnormal; Notable for the following components:    Protein, UA Trace (*)     Mucus, UA 1+ (*)     All other components within normal limits       Background  Allergies: No Known Allergies  History:   Past Medical History:   Diagnosis Date    Blind one eye     Diabetes (HCC)     HTN (hypertension)     Parkinson's disease (HCC)     Seizure (HCC)        Assessment  Vitals:         Vitals:    03/24/24 1900 03/24/24 1905 03/24/24 1931 03/24/24 2101   BP: 128/75  123/80 134/88   Pulse: 56 62 57 59   Resp: 21 15 16 21    Temp:       SpO2:  100% 99% 100%   Weight:       Height:         Deterioration Index (DI):    Deterioration Index (DI) Interventions Performed:    O2 Flow Rate:    O2 Device:    Cardiac Rhythm:    Critical Lab Results: @LABCR @  Cultures: Cultures:None  NIH Score: NIH     Active LDA's:   Peripheral IV 03/24/24 Proximal;Right;Anterior Forearm (Active)     Active Central Lines:                          Active Wounds:    Active Foley's:  Active Feeding Tubes:      Administered Medications:   Medications   magnesium sulfate 2000 mg in 50 mL IVPB premix (has no administration in time range)   sodium chloride flush 0.9 % injection 5-40 mL (has no administration in time range)   sodium chloride flush 0.9 % injection 5-40 mL (has no administration in time range)   0.9 % sodium chloride infusion (has no administration in time range)   potassium chloride (KLOR-CON M) extended release tablet 40 mEq (has no administration in time range)     Or   potassium bicarb-citric acid (EFFER-K) effervescent tablet 40 mEq (has no administration in time range)     Or   potassium chloride 10 mEq/100 mL IVPB (Peripheral Line) (has no administration in time range)   magnesium sulfate 2000 mg in 50 mL IVPB premix (has no administration in time range)   enoxaparin (LOVENOX) injection 40 mg (has no administration in time range)   ondansetron (ZOFRAN-ODT) disintegrating tablet 4 mg (has no administration in time range)     Or   ondansetron (ZOFRAN) injection 4 mg (has no administration in time range)   polyethylene glycol (GLYCOLAX) packet 17 g (has no administration in time range)   acetaminophen (TYLENOL) tablet 650 mg (has no administration in time range)     Or   acetaminophen (TYLENOL) suppository 650 mg (has no administration in time range)   phenytoin  (DILANTIN ) ER capsule 100 mg (has no  administration in time range)   metFORMIN  (GLUCOPHAGE -XR) extended release tablet 500 mg (has no administration in time range)   nicotine (NICODERM CQ) 14 MG/24HR 1 patch (has no administration in time range)   sodium chloride 0.9 % bolus 1,000 mL (0 mLs IntraVENous Stopped 03/24/24 1955)     Last documented pain medication administration: na  Pertinent or High Risk Medications/Drips: no   If Yes, please provide details: na  Blood Product Administration: no  If Yes, please provide details: na  Process Protocols/Bundles: N/A    Recommendation  Incomplete STAT orders: na  Overdue Medications: na  Patient Belongings:    Additional Comments: na  If any further questions, please call Sending RN at [phone]       Admitting Unit Notification  Name of person notified and time: 5W 2149      Electronically signed by: Electronically signed by Rogenia Clause, RN on 03/24/2024 at 9:44 PM

## 2024-03-24 NOTE — ED Triage Notes (Signed)
 Patient states that he is on a road trip to [facility] and started having chest pain while driving. States that pain comes and goes.     Denies SOB    Patient alert and oriented and ambulatory to triage.

## 2024-03-25 ENCOUNTER — Observation Stay: Admit: 2024-03-26 | Payer: MEDICAID

## 2024-03-25 LAB — MAGNESIUM
Magnesium: 1.4 mg/dL — ABNORMAL LOW (ref 1.6–2.4)
Magnesium: 2.4 mg/dL (ref 1.6–2.4)

## 2024-03-25 LAB — URINALYSIS WITH REFLEX TO CULTURE
BACTERIA, URINE: NEGATIVE /HPF
Bilirubin, Urine: NEGATIVE
Blood, Urine: NEGATIVE
Glucose, Ur: NEGATIVE mg/dL
Ketones, Urine: NEGATIVE mg/dL
Leukocyte Esterase, Urine: NEGATIVE
Nitrite, Urine: NEGATIVE
Specific Gravity, UA: 1.015 (ref 1.003–1.030)
Urobilinogen, Urine: 0.1 EU/dL (ref 0.1–1.0)
pH, Urine: 7 (ref 5.0–8.0)

## 2024-03-25 LAB — CBC WITH AUTO DIFFERENTIAL
Basophils %: 0.4 % (ref 0.0–1.0)
Basophils Absolute: 0.03 10*3/uL (ref 0.00–0.10)
Eosinophils %: 1.8 % (ref 0.0–7.0)
Eosinophils Absolute: 0.12 10*3/uL (ref 0.00–0.40)
Hematocrit: 39.9 % (ref 36.6–50.3)
Hemoglobin: 13.3 g/dL (ref 12.1–17.0)
Immature Granulocytes %: 0.1 % (ref 0–0.5)
Immature Granulocytes Absolute: 0.01 10*3/uL (ref 0.00–0.04)
Lymphocytes %: 27.8 % (ref 12.0–49.0)
Lymphocytes Absolute: 1.86 10*3/uL (ref 0.80–3.50)
MCH: 28.3 pg (ref 26.0–34.0)
MCHC: 33.3 g/dL (ref 30.0–36.5)
MCV: 84.9 FL (ref 80.0–99.0)
MPV: 10.7 FL (ref 8.9–12.9)
Monocytes %: 8.4 % (ref 5.0–13.0)
Monocytes Absolute: 0.56 10*3/uL (ref 0.00–1.00)
Neutrophils %: 61.5 % (ref 32.0–75.0)
Neutrophils Absolute: 4.12 10*3/uL (ref 1.80–8.00)
Nucleated RBCs: 0 /100{WBCs}
Platelets: 218 10*3/uL (ref 150–400)
RBC: 4.7 M/uL (ref 4.10–5.70)
RDW: 15 % — ABNORMAL HIGH (ref 11.5–14.5)
WBC: 6.7 10*3/uL (ref 4.1–11.1)
nRBC: 0 10*3/uL (ref 0.00–0.01)

## 2024-03-25 LAB — ETHANOL: Ethanol Lvl: <10 mg/dL (ref <10)

## 2024-03-25 LAB — BASIC METABOLIC PANEL
Anion Gap: 4 mmol/L (ref 2–12)
BUN/Creatinine Ratio: 15 (ref 12–20)
BUN: 16 mg/dL (ref 6–20)
CO2: 25 mmol/L (ref 21–32)
Calcium: 8.6 mg/dL (ref 8.5–10.1)
Chloride: 111 mmol/L — ABNORMAL HIGH (ref 97–108)
Creatinine: 1.08 mg/dL (ref 0.70–1.30)
Est, Glom Filt Rate: 86 mL/min/{1.73_m2} (ref >60)
Glucose: 114 mg/dL — ABNORMAL HIGH (ref 65–100)
Potassium: 4 mmol/L (ref 3.5–5.1)
Sodium: 140 mmol/L (ref 136–145)

## 2024-03-25 LAB — HEMOGLOBIN A1C
Estimated Avg Glucose: 111 mg/dL
Hemoglobin A1C: 5.5 % (ref 4.0–5.6)

## 2024-03-25 LAB — URINE DRUG SCREEN
Amphetamine, Urine: NEGATIVE
Barbiturates, Urine: NEGATIVE
Benzodiazepines, Urine: NEGATIVE
Cocaine, Urine: NEGATIVE
Methadone, Urine: NEGATIVE
Opiates, Urine: NEGATIVE
Phencyclidine, Urine: NEGATIVE
THC, TH-Cannabinol, Urine: POSITIVE — AB

## 2024-03-25 LAB — POCT GLUCOSE
POC Glucose: 99 mg/dL (ref 65–100)
POC Glucose: 107 mg/dL — ABNORMAL HIGH (ref 65–100)
POC Glucose: 74 mg/dL (ref 65–100)

## 2024-03-25 LAB — EKG 12-LEAD
Atrial Rate: 66 {beats}/min
Diagnosis: NORMAL
P Axis: 53 degrees
P-R Interval: 140 ms
Q-T Interval: 372 ms
QRS Duration: 72 ms
QTc Calculation (Bazett): 389 ms
R Axis: -18 degrees
T Axis: -14 degrees
Ventricular Rate: 66 {beats}/min

## 2024-03-25 LAB — VITAMIN B12 & FOLATE
Folate: 10.9 ng/mL (ref 5.0–21.0)
Vitamin B-12: 200 pg/mL (ref 193–986)

## 2024-03-25 LAB — LIPID PANEL
Chol/HDL Ratio: 3.4 (ref 0.0–5.0)
Cholesterol, Total: 137 mg/dL (ref <200)
HDL: 40 mg/dL
LDL Cholesterol: 81.8 mg/dL (ref 0–100)
Triglycerides: 76 mg/dL (ref <150)
VLDL Cholesterol Calculated: 15.2 mg/dL

## 2024-03-25 LAB — PHOSPHORUS: Phosphorus: 2.3 mg/dL — ABNORMAL LOW (ref 2.6–4.7)

## 2024-03-25 LAB — D-DIMER, QUANTITATIVE: D-Dimer, Quant: 0.4 ug{FEU}/mL (ref <0.50)

## 2024-03-25 LAB — AMMONIA: Ammonia: 30 umol/L (ref <32)

## 2024-03-25 LAB — BRAIN NATRIURETIC PEPTIDE: NT Pro-BNP: <5 pg/mL (ref <125)

## 2024-03-25 LAB — TROPONIN
Troponin, High Sensitivity: 5 ng/L (ref 0–76)
Troponin, High Sensitivity: 4 ng/L (ref 0–76)

## 2024-03-25 LAB — TSH + FREE T4 PANEL
T4 Free: 0.9 ng/dL (ref 0.8–1.5)
TSH, 3rd Generation: 0.55 u[IU]/mL (ref 0.36–3.74)

## 2024-03-25 MED ORDER — POTASSIUM CHLORIDE CRYS ER 20 MEQ PO TBCR
20 | ORAL | Status: DC | PRN
Start: 2024-03-25 — End: 2024-03-26

## 2024-03-25 MED ORDER — ASPIRIN 81 MG PO CHEW
81 | Freq: Every day | ORAL | Status: DC
Start: 2024-03-25 — End: 2024-03-26
  Administered 2024-03-25 – 2024-03-26 (×2): 81 mg via ORAL

## 2024-03-25 MED ORDER — ACETAMINOPHEN 650 MG RE SUPP
650 | Freq: Four times a day (QID) | RECTAL | Status: DC | PRN
Start: 2024-03-25 — End: 2024-03-26

## 2024-03-25 MED ORDER — ACETAMINOPHEN 325 MG PO TABS
325 | Freq: Four times a day (QID) | ORAL | Status: DC | PRN
Start: 2024-03-25 — End: 2024-03-26

## 2024-03-25 MED ORDER — POTASSIUM BICARB-CITRIC ACID 20 MEQ PO TBEF
20 | ORAL | Status: DC | PRN
Start: 2024-03-25 — End: 2024-03-26

## 2024-03-25 MED ORDER — NORMAL SALINE FLUSH 0.9 % IV SOLN
0.9 | INTRAVENOUS | Status: DC | PRN
Start: 2024-03-25 — End: 2024-03-26

## 2024-03-25 MED ORDER — NORMAL SALINE FLUSH 0.9 % IV SOLN
0.9 | Freq: Two times a day (BID) | INTRAVENOUS | Status: DC
Start: 2024-03-25 — End: 2024-03-26
  Administered 2024-03-25 – 2024-03-26 (×4): 10 mL via INTRAVENOUS

## 2024-03-25 MED ORDER — POLYETHYLENE GLYCOL 3350 17 G PO PACK
17 | Freq: Every day | ORAL | Status: DC | PRN
Start: 2024-03-25 — End: 2024-03-26

## 2024-03-25 MED ORDER — ENOXAPARIN SODIUM 40 MG/0.4ML IJ SOSY
40 | Freq: Every day | INTRAMUSCULAR | Status: DC
Start: 2024-03-25 — End: 2024-03-26
  Administered 2024-03-26: 14:00:00 40 mg via SUBCUTANEOUS

## 2024-03-25 MED ORDER — ONDANSETRON HCL 4 MG/2ML IJ SOLN
4 | Freq: Four times a day (QID) | INTRAMUSCULAR | Status: DC | PRN
Start: 2024-03-25 — End: 2024-03-26

## 2024-03-25 MED ORDER — POTASSIUM CHLORIDE 10 MEQ/100ML IV SOLN
10 | INTRAVENOUS | Status: DC | PRN
Start: 2024-03-25 — End: 2024-03-26

## 2024-03-25 MED ORDER — NORMAL SALINE FLUSH 0.9 % IV SOLN
0.9 | Freq: Two times a day (BID) | INTRAVENOUS | Status: DC
Start: 2024-03-25 — End: 2024-03-25

## 2024-03-25 MED ORDER — METFORMIN HCL ER 500 MG PO TB24
500 | Freq: Every day | ORAL | Status: DC
Start: 2024-03-25 — End: 2024-03-26
  Administered 2024-03-25 – 2024-03-26 (×2): 500 mg via ORAL

## 2024-03-25 MED ORDER — NICOTINE 14 MG/24HR TD PT24
14 | Freq: Every day | TRANSDERMAL | Status: DC
Start: 2024-03-25 — End: 2024-03-26
  Administered 2024-03-25 – 2024-03-26 (×2): 1 via TRANSDERMAL

## 2024-03-25 MED ORDER — MAGNESIUM SULFATE 2000 MG/50 ML IVPB PREMIX
2 | INTRAVENOUS | Status: DC | PRN
Start: 2024-03-25 — End: 2024-03-26

## 2024-03-25 MED ORDER — ONDANSETRON 4 MG PO TBDP
4 | Freq: Three times a day (TID) | ORAL | Status: DC | PRN
Start: 2024-03-25 — End: 2024-03-26

## 2024-03-25 MED ORDER — SODIUM CHLORIDE 0.9 % IV SOLN
0.9 | INTRAVENOUS | Status: DC | PRN
Start: 2024-03-25 — End: 2024-03-26

## 2024-03-25 MED ORDER — PHENYTOIN SODIUM EXTENDED 100 MG PO CAPS
100 | Freq: Three times a day (TID) | ORAL | Status: DC
Start: 2024-03-25 — End: 2024-03-26
  Administered 2024-03-25 – 2024-03-26 (×4): 100 mg via ORAL

## 2024-03-25 MED ORDER — BISACODYL 5 MG PO TBEC
5 | Freq: Every day | ORAL | Status: DC | PRN
Start: 2024-03-25 — End: 2024-03-26

## 2024-03-25 MED ORDER — MAGNESIUM SULFATE 2000 MG/50 ML IVPB PREMIX
2 | Freq: Once | INTRAVENOUS | Status: AC
Start: 2024-03-25 — End: 2024-03-25
  Administered 2024-03-25: 02:00:00 2000 mg via INTRAVENOUS

## 2024-03-25 MED FILL — PHENYTOIN SODIUM EXTENDED 100 MG PO CAPS: 100 MG | ORAL | Qty: 1 | Fill #0

## 2024-03-25 MED FILL — SODIUM CHLORIDE FLUSH 0.9 % IV SOLN: 0.9 % | INTRAVENOUS | Qty: 40 | Fill #0

## 2024-03-25 MED FILL — ENOXAPARIN SODIUM 40 MG/0.4ML IJ SOSY: 40 MG/0.4ML | INTRAMUSCULAR | Qty: 0.4 | Fill #0

## 2024-03-25 MED FILL — ASPIRIN LOW STRENGTH 81 MG PO CHEW: 81 MG | ORAL | Qty: 1 | Fill #0

## 2024-03-25 MED FILL — MAGNESIUM SULFATE 2 GM/50ML IV SOLN: 2 GM/50ML | INTRAVENOUS | Qty: 50 | Fill #0

## 2024-03-25 MED FILL — NICOTINE 14 MG/24HR TD PT24: 14 MG/24HR | TRANSDERMAL | Qty: 1 | Fill #0

## 2024-03-25 MED FILL — METFORMIN HCL ER 500 MG PO TB24: 500 MG | ORAL | Qty: 1 | Fill #0

## 2024-03-25 NOTE — Care Coordination-Inpatient (Signed)
 03/25/24 1322   Service Assessment   Patient Orientation Alert and Oriented   Cognition Alert   History Provided By Patient   Primary Caregiver Self   Support Systems Children   Patient's Healthcare Decision Maker is: Legal Next of Kin   PCP Verified by CM Yes   Last Visit to PCP Within last 3 months   Prior Functional Level Independent in ADLs/IADLs   Current Functional Level Independent in ADLs/IADLs   Can patient return to prior living arrangement Yes   Ability to make needs known: Good   Family able to assist with home care needs: Yes   Would you like for me to discuss the discharge plan with any other family members/significant others, and if so, who? No   Financial Resources Medicaid   Social/Functional History   Lives With Daughter   Home Equipment None   Prior Level of Assist for ADLs Independent   Ambulation Assistance Independent   Discharge Planning   Patient expects to be discharged to: [employer] At/After Discharge   Mode of Transport at Discharge Self     Patient lives in [place], [place] with his daughter.  He was driving himself to [facility] when he had to stop at the hospital due to chest pain.  Patient is typically independent and uses no DME.  Patient plans to return back to [place] at discharge.  Patient did voice concerns about affording medications even with Medicaid insurance.  Patient also voiced food concerns.  CM stated that CM could submit a referral through [employer]  and see if there was anything in [place] for patient.  Patient agreeable.  Current Dispo: home/self    Advance Care Planning     General Advance Care Planning (ACP) Conversation    Date of Conversation: 03/25/2024  Conducted with: Patient with Decision Making Capacity  Other persons present: None    Healthcare Decision Maker: No healthcare decision makers have been documented.     Today we documented Decision Maker(s) consistent with Legal Next of Kin hierarchy.  Content/Action Overview:  Has NO ACP documents-Information  provided  Reviewed DNR/DNI and patient elects Full Code (Attempt Resuscitation)        Length of Voluntary ACP Conversation in minutes:  <16 minutes (Non-Billable)    Asenath Blacker, RN

## 2024-03-25 NOTE — Consults (Signed)
 Will order echo, nst for possible cardiogenic syncope, full consult to follow.

## 2024-03-25 NOTE — Progress Notes (Signed)
 Pt continues to deny any chest pain, SOB, or dizziness. No further complaints at this time.

## 2024-03-25 NOTE — Significant Event (Cosign Needed)
 Chest pain:    49 year old male admitted on 03/24/2024 with syncope and chest pain that waxes and wanes.  Cardiology has already been consulted and echocardiogram is pending and may include stress test.  Troponin has been normal x 2 and currently pending x 2.  TIMI score was 2.  EKG without ischemic changes.  Chest x-ray without acute process.  Will defer to cardiology for ongoing workup unless troponins are significantly increased.  At that point we will consider starting anticoagulation    Pt's TIMI Score =     TIMI Score Calculation (1 point for each):  - Age >= 76  - Aspirin use in the last 7 days   - At least 2 angina episodes within the last 24hrs  - ST changes of at least 0.21mm on admission EKG  - Elevated serum cardiac biomarkers  - Known Coronary Artery Disease (CAD) (coronary stenosis >= 50%)  - At least 3 risk factors for CAD:  Hypertension -> 140/90 or on antihypertensives, Cigarette smoking, HDL < 40, Diabetes, Family history of premature CAD (CAD in male first-degree relative, or father less than 4, or male first-degree relative or mother less than 30).    Score Interpretation:  % risk at 14 days of: all-cause mortality, new or recurrent MI, or severe recurrent ischemia requiring urgent revascularization.  Score of 0-1 = 4.7% risk  Score of 2 = 8.3% risk  Score of 3 = 13.2% risk  Score of 4 = 19.9% risk  Score of 5 = 26.2% risk  Score of 6-7 = at least 40.9% risk    Two or More Episodes of Severe Angina within 24 Hours: 1  Elevated Cardiac Markers: 0  ST Changes > 0.5 mm: 0  Aspirin Use in the Past 7 Days: 1  Age 51+: 0  3+ CAD Risk Factors: 0  CAD Stenosis >= 50%: 0  TIMI Risk Score (Calculated): 2

## 2024-03-25 NOTE — Progress Notes (Signed)
**Note Anthony-Identified via Obfuscation**  Hospitalist Progress Note    NAME:   Anthony Schultz   DOB: [birth year]   MRN: [id number]     Date/Time: 03/25/2024 9:39 AM  Patient PCP: No, Pcp    Estimated discharge date:48h  Barriers: cardiology consult    Hospital Course:  Anthony Schultz, a 48 year old male with diabetes and seizure history, presented with intermittent chest pain while driving, which worsened prompting ED visit. He experienced syncope in the ED but remained alert, with unremarkable head CT, troponin, D-dimer, and EKG, and mild pulmonary edema on chest X-ray. He was observed for syncope, and no acute cardiac or neurological deficits were identified.    Assessment / Plan:    Syncope, could be cardiogenic  - Does not appear to be postictal even though patient has not been taking his Dilantin  for a year  -obtain orthostatics   -Place pt on tele to monitor for any arrhytmia   -EKG does not show any heart blocks, ST changes, is a normal sinus rhythm  -CT head appears to be normal, unlikely neurogenic  -Does not appear to have any medication causing orthostatic hypotension  -Drug screen negative   -ECHO to assess for valvular heart disease  -TSH/T4  -Replete electrolyte  -Consult cardiology for outpatient follow-up     Chest pain, resolved  - Troponin negative x 2, EKG does not have any ST changes  -Monitor for recurrence  - Will continue aspirin  -Chest x-ray shows pulmonary edema, however is on room air and no shortness of breath orthopnea or PND or swelling  - Never had a stress test   -Follow-up lipid panel HbA1c  - TIMI 0  - Will consult cardio for stress test inpatient versus outpatient     Hypomagnesemia  - Repleted IV  - Follow-up BMP     Seizure disorder  - Continue Dilantin   - Noncompliant, did not have any seizure recurrence, however 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 Problems (any 1)  [x]  Acute/Chronic Illness/injury posing threat to life or bodily function:    []  Severe exacerbation of chronic illness:    ---------------------------------------------------------------------  B. Risk of Treatment (any 1)   []  Drugs/treatments that require intensive monitoring for toxicity include:    []  IV ABX requiring serial renal monitoring for nephrotoxicity:     []  IV Narcotic analgesia for adverse drug reaction  []  Aggressive IV diuresis requiring serial monitoring for renal impairment and electrolyte derangements  []  Critical electrolyte abnormalities requiring IV replacement and close serial monitoring  []  Insulin - monitoring serial FSBS for Hypoglycemic adverse drug reaction  [x]  Other - trend troponin   []  Change in code status:    []  Decision to escalate care:    []  Major surgery/procedure with associated risk factors:    ----------------------------------------------------------------------  C. Data (any 2)  [x]  Discussed current management and discharge planning options with Case Management.  []  Discussed management of the case with:    []  Telemetry personally reviewed and interpreted as documented above    []  Imaging personally reviewed and interpreted, includes:    [  x] Data Review (any 3)  [x]  All available Consultant notes from yesterday/today were reviewed  [x]  All current labs were reviewed and interpreted for clinical significance   [x]  Appropriate follow-up labs were ordered  []  Collateral history obtained from:       Subjective:     Chief Complaint / Reason for Physician Visit  Patient is overall doing better, no active complaints, denies chest pain, shortness of breath, nausea, vomiting, lightheaded/dizziness, change in bowel movement urination.  Discussed with RN events overnight.       Objective:      VITALS:   Last 24hrs VS reviewed since prior progress note. Most recent are:  Patient Vitals for the past 24 hrs:   BP Temp Temp src Pulse Resp SpO2 Height Weight   03/25/24 0703 -- -- -- 63 -- -- -- --   03/25/24 0031 -- -- -- -- -- -- -- 93.7 kg (206 lb 9.1 oz)   03/25/24 0028 -- -- -- 57 -- -- -- --   03/24/24 2217 132/88 97.5 F (36.4 C) Oral 63 18 97 % -- --   03/24/24 2101 134/88 -- -- 59 21 100 % -- --   03/24/24 1931 123/80 -- -- 57 16 99 % -- --   03/24/24 1905 -- -- -- 62 15 100 % -- --   03/24/24 1900 128/75 -- -- 56 21 -- -- --   03/24/24 1650 -- -- -- -- -- -- 1.626 m (5\' 4" ) 86.2 kg (190 lb)   03/24/24 1648 121/80 98.8 F (37.1 C) -- 76 18 97 % -- --         Intake/Output Summary (Last 24 hours) at 03/25/2024 6578  Last data filed at 03/24/2024 1955  Gross per 24 hour   Intake 1000 ml   Output --   Net 1000 ml        I had a face to face encounter and independently examined this patient on 03/25/2024, as outlined below:    Review of Systems   Respiratory: Negative.     Cardiovascular: Negative.    Gastrointestinal: Negative.    Genitourinary: Negative.    Musculoskeletal: Negative.    Neurological: Negative.         PHYSICAL EXAM:  Physical Exam  Cardiovascular:      Rate and Rhythm: Normal rate.      Pulses: Normal pulses.   Pulmonary:      Effort: Pulmonary effort is normal.   Musculoskeletal:      Right lower leg: No edema.      Left lower leg: No edema.   Neurological:      Mental Status: He is alert. Mental status is at baseline.          Reviewed most current lab test results and cultures  YES  Reviewed most current radiology test results   YES  Review and summation of old records today    NO  Reviewed patient's current orders and MAR    YES  PMH/SH reviewed - no change compared to H&P  ________________________________________________________________________  Care Plan discussed with:    Comments   Patient x    Family      RN x    Care Manager     Consultant                         Multidiciplinary team rounds were held today with case manager, nursing, pharmacist and Higher education careers adviser.  Patient's plan of care was discussed; medications were reviewed and discharge planning was addressed.     ________________________________________________________________________  Total NON critical care TIME:  35  Minutes    Total CRITICAL CARE TIME Spent:   Minutes non procedure based      Comments   >50% of visit spent in counseling and coordination of care     ________________________________________________________________________  Clydie Darter, MD     Procedures: see electronic medical records for all procedures/Xrays and details which were not copied into this note but were reviewed prior to creation of Plan.      LABS:  I reviewed today's most current labs and imaging studies.  Pertinent labs include:  Recent Labs     03/24/24  1712 03/25/24  0504   WBC 6.5 6.7   HGB 14.0 13.3   HCT 41.3 39.9   PLT 257 218     Recent Labs     03/24/24  1712 03/24/24  1918 03/25/24  0504   NA 140  --  140   K 5.0  --  4.0   CL 111*  --  111*   CO2 25  --  25   BUN 15  --  16   MG  --  1.4* 2.4   PHOS  --   --  2.3*   ALT 31  --   --        Signed: Clydie Darter, MD

## 2024-03-25 NOTE — Progress Notes (Signed)
 Pt called nurse to bedside complaining of 8/10 mid sternal chest pain. Denies pain in left chest or arm. Denies nausea, dizziness but does complain of SOB. Skin diaphoretic. O2 100% on RA, placed on 2L of oxygen for comfort. Notified on call PA and he examined pt at bedside. EKG done negative for STEMI per PA, CXR obtained, troponins ordered and morphine given for pain. Per pt pain was relieved by morphine. Cardiology onboard and new orders placed for echo and stress test. Will continue to monitor pt.

## 2024-03-25 NOTE — Progress Notes (Signed)
 Received Order for Telemetry     Anthony Schultz   24-Sep-1976   [id number]   Syncope and collapse [R55]  Chest pain, unspecified type [R07.9]   Sandralee Crow, MD     Tele Box # [phone] placed on patient at  0015 am  ER Room # 5w  Admitting to Room 589  Transferring Nurse NA  Verified with Primary Nurse RACEL,RN at  0015 am

## 2024-03-25 NOTE — Progress Notes (Signed)
 Patient states he stopped taking his prescription medication because he could not afford it and that he hasn't eaten in days. He says he has a history of Parkinson's. Consulted case management for financial assistance and prescription assistance.

## 2024-03-25 NOTE — Progress Notes (Signed)
 4 Eyes Skin Assessment     NAME:  Anthony Schultz  DATE OF BIRTH:  1975-12-17  MEDICAL RECORD NUMBER:  [id number]    The patient is being assessed for  Admission    I agree that at least one RN has performed a thorough Head to Toe Skin Assessment on the patient. ALL assessment sites listed below have been assessed.      Areas assessed by both nurses:    Head, Face, Ears, Shoulders, Back, Chest, Arms, Elbows, Hands, Sacrum. Buttock, Coccyx, Ischium, Legs. Feet and Heels, and Under Medical Devices         Does the Patient have a Wound? No noted wound(s)       Braden Prevention initiated by RN: Yes  Wound Care Orders initiated by RN: No    Pressure Injury (Stage 3,4, Unstageable, DTI, NWPT, and Complex wounds) if present, place Wound referral order by RN under ORDER ENTRY: No    New Ostomies, if present place, Ostomy referral order under ORDER ENTRY: No     Nurse 1 eSignature: Electronically signed by Wynonia Hedges, RN on 03/25/24 at 2:33 AM EDT    **SHARE this note so that the co-signing nurse can place an eSignature**    Nurse 2 eSignature: Electronically signed by Danne Dustman, RN on 03/25/24 at 3:37 AM EDT

## 2024-03-26 ENCOUNTER — Observation Stay: Admit: 2024-03-26 | Payer: MEDICAID

## 2024-03-26 LAB — ECHO (TTE) COMPLETE (PRN CONTRAST/BUBBLE/STRAIN/3D)
AV Area by Peak Velocity: 2 cm2
AV Area by VTI: 1.8 cm2
AV Mean Gradient: 4 mmHg
AV Mean Velocity: 0.9 m/s
AV Peak Gradient: 7 mmHg
AV Peak Velocity: 1.4 m/s
AV VTI: 33.5 cm
AV Velocity Ratio: 0.64
AVA/BSA Peak Velocity: 1 cm2/m2
AVA/BSA VTI: 0.9 cm2/m2
Ao Root Index: 1.46 cm/m2
Aortic Root: 2.9 cm
Ascending Aorta Index: 1.62 cm/m2
Ascending Aorta: 3.2 cm
Body Surface Area: 1.97 m2
E/E' Lateral: 7.17
E/E' Ratio (Averaged): 8.09
E/E' Septal: 9.01
EF Physician: 60 %
Est. RA Pressure: 3 mmHg
Fractional Shortening 2D: 42 % (ref 28–44)
IVSd: 1 cm (ref 0.6–1.0)
LA Area 4C: 14.4 cm2
LA Diameter: 4 cm
LA Major Axis: 5.8 cm
LA Size Index: 2.02 cm/m2
LA Volume Index MOD A4C: 14 mL/m2 (ref 16–34)
LA Volume MOD A4C: 28 mL (ref 18–58)
LA/AO Root Ratio: 1.38
LV E' Lateral Velocity: 12.7 cm/s
LV E' Septal Velocity: 10.1 cm/s
LV EDV A2C: 131 mL
LV EDV A4C: 129 mL
LV EDV Index A2C: 66 mL/m2
LV EDV Index A4C: 65 mL/m2
LV ESV A2C: 49 mL
LV ESV A4C: 49 mL
LV ESV Index A2C: 25 mL/m2
LV ESV Index A4C: 25 mL/m2
LV Ejection Fraction A2C: 62 %
LV Ejection Fraction A4C: 62 %
LV Mass 2D Index: 82.8 g/m2 (ref 49–115)
LV Mass 2D: 164 g (ref 88–224)
LV RWT Ratio: 0.49
LVIDd Index: 2.27 cm/m2
LVIDd: 4.5 cm (ref 4.2–5.9)
LVIDs Index: 1.31 cm/m2
LVIDs: 2.6 cm
LVOT Area: 3.1 cm2
LVOT Diameter: 2 cm
LVOT Mean Gradient: 1 mmHg
LVOT Peak Gradient: 3 mmHg
LVOT Peak Velocity: 0.9 m/s
LVOT SV: 59.7 mL
LVOT Stroke Volume Index: 30.1 mL/m2
LVOT VTI: 19 cm
LVOT:AV VTI Index: 0.57
LVPWd: 1.1 cm — AB (ref 0.6–1.0)
MR Peak Gradient: 19 mmHg
MR Peak Velocity: 2.2 m/s
MV A Velocity: 0.47 m/s
MV Area by VTI: 1.4 cm2
MV E Velocity: 0.91 m/s
MV E Wave Deceleration Time: 219 ms
MV E/A: 1.94
MV Max Velocity: 1.1 m/s
MV Mean Gradient: 1 mmHg
MV Mean Velocity: 0.5 m/s
MV Peak Gradient: 5 mmHg
MV VTI: 43.7 cm
MV:LVOT VTI Index: 2.3
PR Max Velocity: 1.1 m/s
PV AT: 148 ms
PV Max Velocity: 0.9 m/s
PV Mean Gradient: 2 mmHg
PV Mean Velocity: 0.6 m/s
PV Peak Gradient: 3 mmHg
PV VTI: 24.8 cm
Pulmonary Artery EDP: 5 mmHg
RA Area 4C: 16.6 cm2
RA Volume Index A4C: 23 mL/m2
RA Volume: 45 mL
RV Basal Dimension: 3.8 cm
RV Free Wall Peak S': 14.7 cm/s
RVSP: 16 mmHg
TAPSE: 2.6 cm (ref 1.7–?)
TR Max Velocity: 1.79 m/s
TR Peak Gradient: 13 mmHg
TR VTI: 75.3 cm

## 2024-03-26 LAB — BASIC METABOLIC PANEL
Anion Gap: 5 mmol/L (ref 2–12)
BUN/Creatinine Ratio: 19 (ref 12–20)
BUN: 16 mg/dL (ref 6–20)
CO2: 24 mmol/L (ref 21–32)
Calcium: 8.6 mg/dL (ref 8.5–10.1)
Chloride: 111 mmol/L — ABNORMAL HIGH (ref 97–108)
Creatinine: 0.85 mg/dL (ref 0.70–1.30)
Est, Glom Filt Rate: >90 mL/min/{1.73_m2} (ref >60)
Glucose: 92 mg/dL (ref 65–100)
Potassium: 3.6 mmol/L (ref 3.5–5.1)
Sodium: 140 mmol/L (ref 136–145)

## 2024-03-26 LAB — EKG 12-LEAD
Atrial Rate: 48 {beats}/min
P Axis: 26 degrees
P-R Interval: 152 ms
Q-T Interval: 464 ms
QRS Duration: 72 ms
QTc Calculation (Bazett): 414 ms
R Axis: -34 degrees
T Axis: -24 degrees
Ventricular Rate: 48 {beats}/min

## 2024-03-26 LAB — TROPONIN
Troponin, High Sensitivity: 7 ng/L (ref 0–76)
Troponin, High Sensitivity: 7 ng/L (ref 0–76)

## 2024-03-26 MED ORDER — MORPHINE SULFATE (PF) 2 MG/ML IV SOLN
2 | INTRAVENOUS | Status: DC | PRN
Start: 2024-03-26 — End: 2024-03-26
  Administered 2024-03-26: 01:00:00 2 mg via INTRAVENOUS

## 2024-03-26 MED FILL — METFORMIN HCL ER 500 MG PO TB24: 500 MG | ORAL | Qty: 1 | Fill #0

## 2024-03-26 MED FILL — ASPIRIN LOW STRENGTH 81 MG PO CHEW: 81 MG | ORAL | Qty: 1 | Fill #0

## 2024-03-26 MED FILL — MORPHINE SULFATE 2 MG/ML IJ SOLN: 2 mg/mL | INTRAMUSCULAR | Qty: 1 | Fill #0

## 2024-03-26 MED FILL — PHENYTOIN SODIUM EXTENDED 100 MG PO CAPS: 100 MG | ORAL | Qty: 1 | Fill #0

## 2024-03-26 MED FILL — ENOXAPARIN SODIUM 40 MG/0.4ML IJ SOSY: 40 MG/0.4ML | INTRAMUSCULAR | Qty: 0.4 | Fill #0

## 2024-03-26 MED FILL — NICOTINE 14 MG/24HR TD PT24: 14 MG/24HR | TRANSDERMAL | Qty: 1 | Fill #0

## 2024-03-26 NOTE — Progress Notes (Signed)
 Pt. Signed out against medical advice (AMA). Pt. Stated he had to go drive to [place] and could not wait anymore. Pt. Stated he felt fine and is ready to leave. Dr. Mariane Shire was notified of this matter via perfect serve.

## 2024-03-28 LAB — CBC WITH AUTO DIFFERENTIAL
Basophils %: 0 % (ref 0–1)
Basophils %: 0 % (ref 0–1)
Basophils Absolute: 0 10*3/uL (ref 0.0–0.1)
Basophils Absolute: 0 10*3/uL (ref 0.0–0.1)
Blasts: 3 % — ABNORMAL HIGH
Eosinophils %: 0 % (ref 0–7)
Eosinophils %: 1 % (ref 0–7)
Eosinophils Absolute: 0 10*3/uL (ref 0.0–0.4)
Eosinophils Absolute: 0.06 10*3/uL (ref 0.0–0.4)
Hematocrit: 41.3 % (ref 36.6–50.3)
Hematocrit: 40.9 % (ref 36.6–50.3)
Hemoglobin: 14 g/dL (ref 12.1–17.0)
Hemoglobin: 14.1 g/dL (ref 12.1–17.0)
Immature Granulocytes %: 0 %
Immature Granulocytes %: 0 %
Immature Granulocytes Absolute: 0 10*3/uL
Immature Granulocytes Absolute: 0 10*3/uL
Lymphocytes %: 43 % (ref 12–49)
Lymphocytes %: 40 % (ref 12–49)
Lymphocytes Absolute: 2.8 10*3/uL (ref 0.8–3.5)
Lymphocytes Absolute: 2.2 10*3/uL (ref 0.8–3.5)
MCH: 28.5 pg (ref 26.0–34.0)
MCH: 29.3 pg (ref 26.0–34.0)
MCHC: 33.9 g/dL (ref 30.0–36.5)
MCHC: 34.5 g/dL (ref 30.0–36.5)
MCV: 84.1 FL (ref 80.0–99.0)
MCV: 84.9 FL (ref 80.0–99.0)
MPV: 10.8 FL (ref 8.9–12.9)
MPV: 11.2 FL (ref 8.9–12.9)
Monocytes %: 5 % (ref 5–13)
Monocytes %: 13 % (ref 5–13)
Monocytes Absolute: 0.33 10*3/uL (ref 0.0–1.0)
Monocytes Absolute: 0.72 10*3/uL (ref 0.0–1.0)
Neutrophils %: 52 % (ref 32–75)
Neutrophils %: 43 % (ref 32–75)
Neutrophils Absolute: 3.37 10*3/uL (ref 1.8–8.0)
Neutrophils Absolute: 2.37 10*3/uL (ref 1.8–8.0)
Nucleated RBCs: 0 /100{WBCs}
Nucleated RBCs: 0 /100{WBCs}
Platelets: 257 10*3/uL (ref 150–400)
Platelets: 258 10*3/uL (ref 150–400)
RBC: 4.91 M/uL (ref 4.10–5.70)
RBC: 4.82 M/uL (ref 4.10–5.70)
RDW: 14.9 % — ABNORMAL HIGH (ref 11.5–14.5)
RDW: 15 % — ABNORMAL HIGH (ref 11.5–14.5)
WBC: 6.5 10*3/uL (ref 4.1–11.1)
WBC: 5.5 10*3/uL (ref 4.1–11.1)
nRBC: 0 10*3/uL (ref 0.00–0.01)
nRBC: 0 10*3/uL (ref 0.00–0.01)

## 2024-04-13 ENCOUNTER — Ambulatory Visit: Payer: Medicare Other | Admitting: Internal Medicine

## 2024-07-30 ENCOUNTER — Emergency Department (HOSPITAL_COMMUNITY): Admission: EM | Admit: 2024-07-30 | Source: Home / Self Care

## 2024-07-30 NOTE — ED Notes (Signed)
 Pt stated that I only checked in so that I could get my phone back from my wife who is in the back.  Pt was asked if he would like to be seen by an EDP for his complaints. Pt refused.

## 2024-07-30 NOTE — ED Notes (Signed)
 Pt apparently wanted to visit his wife but was accidentally admitted to ED in error
